# Patient Record
Sex: Female | Born: 1960
Health system: Southern US, Community
[De-identification: ages and names within clinical notes are randomized; demographics above are authoritative.]

## PROBLEM LIST (undated history)

## (undated) DIAGNOSIS — R52 Pain, unspecified: Secondary | ICD-10-CM

## (undated) DIAGNOSIS — M549 Dorsalgia, unspecified: Secondary | ICD-10-CM

## (undated) DIAGNOSIS — G8929 Other chronic pain: Secondary | ICD-10-CM

## (undated) HISTORY — PX: RHINOPLASTY: SUR1284

## (undated) HISTORY — PX: TONSILLECTOMY: SUR1361

## (undated) HISTORY — PX: APPENDECTOMY: SHX54

---

## 2015-03-31 ENCOUNTER — Encounter (HOSPITAL_COMMUNITY): Payer: Self-pay | Admitting: *Deleted

## 2015-03-31 ENCOUNTER — Emergency Department (HOSPITAL_COMMUNITY)
Admission: EM | Admit: 2015-03-31 | Discharge: 2015-03-31 | Disposition: A | Discharge status: Home / Self Care | Payer: BLUE CROSS/BLUE SHIELD | Attending: Emergency Medicine | Admitting: Emergency Medicine

## 2015-03-31 DIAGNOSIS — M546 Pain in thoracic spine: Secondary | ICD-10-CM | POA: Insufficient documentation

## 2015-03-31 DIAGNOSIS — Z880 Allergy status to penicillin: Secondary | ICD-10-CM | POA: Diagnosis not present

## 2015-03-31 MED ORDER — HYDROCODONE-ACETAMINOPHEN 5-325 MG PO TABS: 1.0000 | ORAL_TABLET | ORAL | Status: DC | PRN

## 2015-03-31 MED ORDER — METHOCARBAMOL 500 MG PO TABS: 500.0000 mg | ORAL_TABLET | Freq: Two times a day (BID) | ORAL | Status: DC

## 2015-03-31 MED ORDER — METHOCARBAMOL 500 MG PO TABS
1000.0000 mg | ORAL_TABLET | Freq: Once | ORAL | Status: AC
  Administered 2015-03-31: 1000 mg via ORAL
  Filled 2015-03-31: qty 2

## 2015-03-31 MED ORDER — IBUPROFEN 800 MG PO TABS: 800.0000 mg | ORAL_TABLET | Freq: Three times a day (TID) | ORAL | Status: DC

## 2015-03-31 NOTE — Discharge Instructions (Signed)
Back Pain, Adult Low back pain is very common. About 1 in 5 people have back pain.The cause of low back pain is rarely dangerous. The pain often gets better over time.About half of people with a sudden onset of back pain feel better in just 2 weeks. About 8 in 10 people feel better by 6 weeks.  CAUSES Some common causes of back pain include:  Strain of the muscles or ligaments supporting the spine.  Wear and tear (degeneration) of the spinal discs.  Arthritis.  Direct injury to the back. DIAGNOSIS Most of the time, the direct cause of low back pain is not known.However, back pain can be treated effectively even when the exact cause of the pain is unknown.Answering your caregiver's questions about your overall health and symptoms is one of the most accurate ways to make sure the cause of your pain is not dangerous. If your caregiver needs more information, he or she may order lab work or imaging tests (X-rays or MRIs).However, even if imaging tests show changes in your back, this usually does not require surgery. HOME CARE INSTRUCTIONS For many people, back pain returns.Since low back pain is rarely dangerous, it is often a condition that people can learn to manageon their own.   Remain active. It is stressful on the back to sit or stand in one place. Do not sit, drive, or stand in one place for more than 30 minutes at a time. Take short walks on level surfaces as soon as pain allows.Try to increase the length of time you walk each day.  Do not stay in bed.Resting more than 1 or 2 days can delay your recovery.  Do not avoid exercise or work.Your body is made to move.It is not dangerous to be active, even though your back may hurt.Your back will likely heal faster if you return to being active before your pain is gone.  Pay attention to your body when you bend and lift. Many people have less discomfortwhen lifting if they bend their knees, keep the load close to their bodies,and  avoid twisting. Often, the most comfortable positions are those that put less stress on your recovering back.  Find a comfortable position to sleep. Use a firm mattress and lie on your side with your knees slightly bent. If you lie on your back, put a pillow under your knees.  Only take over-the-counter or prescription medicines as directed by your caregiver. Over-the-counter medicines to reduce pain and inflammation are often the most helpful.Your caregiver may prescribe muscle relaxant drugs.These medicines help dull your pain so you can more quickly return to your normal activities and healthy exercise.  Put ice on the injured area.  Put ice in a plastic bag.  Place a towel between your skin and the bag.  Leave the ice on for 15-20 minutes, 03-04 times a day for the first 2 to 3 days. After that, ice and heat may be alternated to reduce pain and spasms.  Ask your caregiver about trying back exercises and gentle massage. This may be of some benefit.  Avoid feeling anxious or stressed.Stress increases muscle tension and can worsen back pain.It is important to recognize when you are anxious or stressed and learn ways to manage it.Exercise is a great option. SEEK MEDICAL CARE IF:  You have pain that is not relieved with rest or medicine.  You have pain that does not improve in 1 week.  You have new symptoms.  You are generally not feeling well. SEEK   IMMEDIATE MEDICAL CARE IF:   You have pain that radiates from your back into your legs.  You develop new bowel or bladder control problems.  You have unusual weakness or numbness in your arms or legs.  You develop nausea or vomiting.  You develop abdominal pain.  You feel faint. Document Released: 07/19/2005 Document Revised: 01/18/2012 Document Reviewed: 11/20/2013 [employer] Patient Information 2015 [employer]. This information is not intended to replace advice given to you by your health care provider. Make sure you  discuss any questions you have with your health care provider.  

## 2015-03-31 NOTE — ED Provider Notes (Signed)
CSN: [id number]     Arrival date & time 03/31/15  2101 History  This chart was scribed for Elpidio Anis, working with Tilden Fossa, MD by Chestine Spore, ED Scribe. The patient was seen in room WTR7/WTR7 at 10:05 PM.    Chief Complaint  Patient presents with  . Back Pain      The history is provided by the patient. No language interpreter was used.    HPI Comments: Karen Buchanan is a 54 y.o. female who presents to the Emergency Department complaining of mid back pain onset 2 months worsening last week. She notes that her mid back pain radiates to the front of her chest under her breast. Pt reports that when the pain is at its worse she will have abdominal cramps. Pt denies her back pain being worsened by deep breathing. Pt denies injury or trauma. She reports that she is unable to find a comfortable position and that her pain is sometimes alleviated with standing but not for long periods of time. Pt had an appointment today with her pain management doctor but they cancelled the appointment for today. Pt has another appointment with a pain management clinic but it isn't until this Wednesday. She states that she has tried cupping method, acupunture, 800 mg Ibuprofen and hydrocodone with minimal relief for her symptoms. Pt had the hydrocodone remaining from a dental procedure. Pt denies gait problem, SOB, numbness, tingling, abdominal pain, CP, and any other symptoms. Pt denies having back surgeries in the past. Pt denies having a MRI of her back. Pt has not seen an orthopedist or neurologist.    History reviewed. No pertinent past medical history. Past Surgical History  Procedure Laterality Date  . Rhinoplasty    . Tonsillectomy     No family history on file. Social History  Substance Use Topics  . Smoking status: Never Smoker   . Smokeless tobacco: None  . Alcohol Use: Yes     Comment: socially   OB History    No data available     Review of Systems  Respiratory: Negative for  shortness of breath.   Cardiovascular: Negative for chest pain.  Gastrointestinal: Negative for abdominal pain.  Musculoskeletal: Positive for back pain. Negative for gait problem.  Skin: Negative for color change, rash and wound.      Allergies  Penicillins  Home Medications   Prior to Admission medications   Not on File   BP 123/61 mmHg  Pulse 71  Temp(Src) 98.2 F (36.8 C) (Oral)  Resp 16  SpO2 98% Physical Exam  Constitutional: She is oriented to person, place, and time. She appears well-developed and well-nourished. No distress.  HENT:  Head: Normocephalic and atraumatic.  Eyes: EOM are normal.  Neck: Neck supple. No tracheal deviation present.  Cardiovascular: Normal rate.   Pulmonary/Chest: Effort normal and breath sounds normal. No respiratory distress. She has no wheezes. She has no rales. She exhibits no tenderness.  Abdominal: Soft. There is no tenderness.  Musculoskeletal: Normal range of motion.  No swelling of the thoracic spine or paraspinal area. No reproducible tenderness. Full ROM.   Neurological: She is alert and oriented to person, place, and time.  Skin: Skin is warm and dry.  Multiple areas of circular hyperpigmentation consistent with cupping.  Psychiatric: She has a normal mood and affect. Her behavior is normal.  Nursing note and vitals reviewed.   ED Course  Procedures (including critical care time) DIAGNOSTIC STUDIES: Oxygen Saturation is 98% on RA, nl  by my interpretation.    COORDINATION OF CARE: 10:09 PM Discussed treatment plan with pt at bedside which includes muscle relaxer Rx, MRI to be completed out patient, and f/u with pain management clinic and pt agreed to plan.   Labs Review Labs Reviewed - No data to display  Imaging Review No results found. I have personally reviewed and evaluated these images and lab results as part of my medical decision-making.   EKG Interpretation None      MDM   Final diagnoses:  None     1. Midline thoracic back pain  The patient describes pain that is more intense that previous back pain now radiating around to the front, concerning for thoracic radicular pattern. No neurologic deficits. Will arrange MRI for outpatient appointment and follow up with orthopedics.   I personally performed the services described in this documentation, which was scribed in my presence. The recorded information has been reviewed and is accurate.     Elpidio Anis, PA-C 04/01/15 2039  Tilden Fossa, MD 04/01/15 2232

## 2015-03-31 NOTE — ED Notes (Signed)
Pt c/o back / spine pain; pt denies injury; denies numbness or tingling; pt states that she has been using cupping and accupuncture and that it has not helped; pt states that she she made an appointment with the pain management but it is Wed and she is having trouble with pain until then; pt states that she has been taking  of Ibuprofen and Percocet (that was left from a dental procedure) that only provides 2 hrs of relief; pt states that she cannot get comfortable or find a position to rest and sleep in

## 2015-04-04 ENCOUNTER — Emergency Department (HOSPITAL_COMMUNITY)
Admission: EM | Admit: 2015-04-04 | Discharge: 2015-04-04 | Disposition: A | Discharge status: Home / Self Care | Payer: BLUE CROSS/BLUE SHIELD | Attending: Emergency Medicine | Admitting: Emergency Medicine

## 2015-04-04 ENCOUNTER — Encounter (HOSPITAL_COMMUNITY): Payer: Self-pay | Admitting: Emergency Medicine

## 2015-04-04 DIAGNOSIS — Z880 Allergy status to penicillin: Secondary | ICD-10-CM | POA: Insufficient documentation

## 2015-04-04 DIAGNOSIS — IMO0002 Reserved for concepts with insufficient information to code with codable children: Secondary | ICD-10-CM

## 2015-04-04 DIAGNOSIS — Z760 Encounter for issue of repeat prescription: Secondary | ICD-10-CM

## 2015-04-04 DIAGNOSIS — Z791 Long term (current) use of non-steroidal anti-inflammatories (NSAID): Secondary | ICD-10-CM | POA: Diagnosis not present

## 2015-04-04 DIAGNOSIS — G8929 Other chronic pain: Secondary | ICD-10-CM | POA: Insufficient documentation

## 2015-04-04 DIAGNOSIS — W108XXD Fall (on) (from) other stairs and steps, subsequent encounter: Secondary | ICD-10-CM | POA: Diagnosis not present

## 2015-04-04 DIAGNOSIS — Z79899 Other long term (current) drug therapy: Secondary | ICD-10-CM | POA: Diagnosis not present

## 2015-04-04 DIAGNOSIS — S22080D Wedge compression fracture of T11-T12 vertebra, subsequent encounter for fracture with routine healing: Secondary | ICD-10-CM | POA: Insufficient documentation

## 2015-04-04 DIAGNOSIS — M549 Dorsalgia, unspecified: Secondary | ICD-10-CM | POA: Diagnosis present

## 2015-04-04 HISTORY — DX: Pain, unspecified: R52

## 2015-04-04 HISTORY — DX: Dorsalgia, unspecified: M54.9

## 2015-04-04 HISTORY — DX: Other chronic pain: G89.29

## 2015-04-04 MED ORDER — HYDROCODONE-ACETAMINOPHEN 5-325 MG PO TABS: 2.0000 | ORAL_TABLET | ORAL | Status: DC | PRN

## 2015-04-04 NOTE — ED Notes (Signed)
Pt left before receiving discharge vitals. Pt received discharge paperwork/instructions before leaving

## 2015-04-04 NOTE — Discharge Instructions (Signed)
Medication Refill, Emergency Department Follow-up with your orthopedist on Friday. We have refilled your medication today as a courtesy to you. It is best for your medical care, however, to take care of getting refills done through your primary caregiver's office. They have your records and can do a better job of follow-up than we can in the emergency department. On maintenance medications, we often only prescribe enough medications to get you by until you are able to see your regular caregiver. This is a more expensive way to refill medications. In the future, please plan for refills so that you will not have to use the emergency department for this. Thank you for your help. Your help allows [place] to better take care of the daily emergencies that enter our department. Document Released: 11/05/2003 Document Revised: 10/11/2011 Document Reviewed: 10/26/2013 [facility] Patient Information 2015 [place], [place]. This information is not intended to replace advice given to you by your health care provider. Make sure you discuss any questions you have with your health care provider.

## 2015-04-04 NOTE — ED Provider Notes (Signed)
History  This chart was scribed for non-physician practitioner, Catha Gosselin, PA-C,working with Lyndal Pulley, MD, by Karle Plumber, ED Scribe. This patient was seen in room WTR7/WTR7 and the patient's care was started at 7:32 PM.  Chief Complaint  Patient presents with  . Back Pain   The history is provided by the patient and medical records. No language interpreter was used.    HPI Comments:  Karen Buchanan is a 54 y.o. female with PMHx of chronic back pain, who presents to the Emergency Department complaining of worsening, severe mid back pain. Pt reports that she fell down a flight of stairs about 7 months ago and states the pain started increasing about three weeks ago. She was seen here and had an outpatient MRI scheduled and was prescribed #8 Vicodin, Robaxin and Ibuprofen. She states her MRI was done yesterday and showed a T-11 compression fracture with degenerative changes. Pt states she was seen by her orthopedist two days ago and was prescribed Tramadol and Diclofenac that she reports taking as directed that was helping at first but now is not due to the pain getting worse. She states she has an appt with her orthopedist on Friday 04/13/15 (one week). Lying or any other movements exacerbate the pain. She denies alleviating factors. She denies bowel or bladder incontinence, bruising, wounds, numbness, tingling or weakness of any extremities, nausea, vomiting, fever or chills. She denies any new trauma, injury or fall.  Past Medical History  Diagnosis Date  . Chronic back pain   . Pain management    Past Surgical History  Procedure Laterality Date  . Rhinoplasty    . Tonsillectomy     No family history on file. Social History  Substance Use Topics  . Smoking status: Never Smoker   . Smokeless tobacco: None  . Alcohol Use: Yes     Comment: socially   OB History    No data available     Review of Systems  Constitutional: Negative for fever and chills.   Gastrointestinal: Negative for nausea and vomiting.  Genitourinary:       No bowel or bladder incontinence  Musculoskeletal: Positive for back pain.  Skin: Negative for color change and wound.  Neurological: Negative for weakness and numbness.    Allergies  Penicillins  Home Medications   Prior to Admission medications   Medication Sig Start Date End Date Taking? Authorizing Provider  diclofenac (VOLTAREN) 75 MG EC tablet Take 75 mg by mouth 2 (two) times daily as needed for moderate pain.   Yes Historical Provider, MD  HYDROcodone-acetaminophen (NORCO/VICODIN) 5-325 MG per tablet Take 1-2 tablets by mouth every 4 (four) hours as needed for moderate pain or severe pain.   Yes Historical Provider, MD  ibuprofen (ADVIL,MOTRIN) 800 MG tablet Take 1 tablet (800 mg total) by mouth 3 (three) times daily. 03/31/15  Yes Shari Upstill, PA-C  methocarbamol (ROBAXIN) 500 MG tablet Take 1 tablet (500 mg total) by mouth 2 (two) times daily. 03/31/15  Yes Elpidio Anis, PA-C  PRESCRIPTION MEDICATION Apply 1 application topically daily. Bi-Est Cream   Yes Historical Provider, MD  PRESCRIPTION MEDICATION Apply 1 application topically daily. Progestorone Cream   Yes Historical Provider, MD  testosterone (ANDROGEL) 50 MG/5GM (1%) GEL Place 5 g onto the skin daily.   Yes Historical Provider, MD  traMADol (ULTRAM) 50 MG tablet Take 50 mg by mouth every 6 (six) hours as needed for moderate pain or severe pain.   Yes Historical Provider, MD  HYDROcodone-acetaminophen (NORCO/VICODIN) 5-325 MG per tablet Take 2 tablets by mouth every 4 (four) hours as needed. 04/04/15   Catha Gosselin, PA-C   Triage Vitals: BP 114/76 mmHg  Pulse 59  Temp(Src) 98.3 F (36.8 C) (Oral)  Resp 16  SpO2 99% Physical Exam  Constitutional: She is oriented to person, place, and time. She appears well-developed and well-nourished.  HENT:  Head: Normocephalic and atraumatic.  Eyes: Conjunctivae and EOM are normal.  Neck: Normal  range of motion.  Cardiovascular: Normal rate.   Pulmonary/Chest: Effort normal. No respiratory distress.  Abdominal: Soft.  Musculoskeletal: Normal range of motion.  Neurological: She is alert and oriented to person, place, and time. She has normal strength. No sensory deficit. GCS eye subscore is 4. GCS verbal subscore is 5. GCS motor subscore is 6.  Ambulatory with steady gait. No lower extremity weakness or saddle anesthesia. No numbness in lower extremities.  Skin: Skin is warm and dry.  Psychiatric: She has a normal mood and affect. Her behavior is normal.  Nursing note and vitals reviewed.   ED Course  Procedures (including critical care time) DIAGNOSTIC STUDIES: Oxygen Saturation is 99% on RA, normal by my interpretation.   COORDINATION OF CARE: 7:46 PM- Will prescribe pt 6 more Vicodin and explained to her that we cannot treat chronic pain in the emergency department and she should follow up with orthopedist or PCP for further pain control. Pt verbalizes understanding and agrees to plan.   MDM   Final diagnoses:  Compression fracture  Medication refill  Patient presents for back pain that is chronic in nature.  Recent MRI results show T11 compression fracture. She was given diclofenac and tramadol by her orthopedist which she has with her and states it does not help.  She is requesting hydrocodone for pain since it is the only thing that helps. She was give #8 by the PA four days ago.  I discussed that I would provide 6 hydrocodone for her pain until her appointment next week with her orthopedist. I also discussed that we do not provider narcotic pain medication as a refill from the ED and that this was a courtesy until she can see her physician. Patient stated she would go to another hospital for pain medication if necessary. Medications - No data to display  I personally performed the services described in this documentation, which was scribed in my presence. The recorded  information has been reviewed and is accurate.    Catha Gosselin, PA-C 04/05/15 1329  Lyndal Pulley, MD 04/05/15 [phone]

## 2015-04-04 NOTE — ED Notes (Signed)
Pt reports back pain, MRI done yesterday. T9 compression fracture with disc bulging. Sts she ran out of hydrocodone, had meds changed by pain MD, but they are not working. Requesting pain meds to get her through the weekend until she can be seen by MD again on Tuesday.

## 2015-04-29 ENCOUNTER — Other Ambulatory Visit: Payer: Self-pay | Admitting: Family Medicine

## 2015-04-29 DIAGNOSIS — E041 Nontoxic single thyroid nodule: Secondary | ICD-10-CM

## 2015-05-14 ENCOUNTER — Ambulatory Visit
Admission: RE | Admit: 2015-05-14 | Discharge: 2015-05-14 | Disposition: A | Discharge status: Home / Self Care | Payer: BLUE CROSS/BLUE SHIELD | Source: Ambulatory Visit | Attending: Family Medicine | Admitting: Family Medicine

## 2015-05-14 DIAGNOSIS — E041 Nontoxic single thyroid nodule: Secondary | ICD-10-CM

## 2015-05-22 ENCOUNTER — Other Ambulatory Visit: Payer: Self-pay | Admitting: Family Medicine

## 2015-05-22 DIAGNOSIS — E041 Nontoxic single thyroid nodule: Secondary | ICD-10-CM

## 2015-05-28 ENCOUNTER — Other Ambulatory Visit (HOSPITAL_COMMUNITY)
Admission: RE | Admit: 2015-05-28 | Discharge: 2015-05-28 | Disposition: A | Discharge status: Home / Self Care | Payer: BLUE CROSS/BLUE SHIELD | Source: Ambulatory Visit | Attending: Radiology | Admitting: Radiology

## 2015-05-28 ENCOUNTER — Ambulatory Visit
Admission: RE | Admit: 2015-05-28 | Discharge: 2015-05-28 | Disposition: A | Discharge status: Home / Self Care | Payer: BLUE CROSS/BLUE SHIELD | Source: Ambulatory Visit | Attending: Family Medicine | Admitting: Family Medicine

## 2015-05-28 ENCOUNTER — Other Ambulatory Visit: Payer: BLUE CROSS/BLUE SHIELD

## 2015-05-28 DIAGNOSIS — E041 Nontoxic single thyroid nodule: Secondary | ICD-10-CM | POA: Diagnosis not present

## 2015-07-16 ENCOUNTER — Other Ambulatory Visit: Payer: Self-pay | Admitting: Family Medicine

## 2015-07-16 ENCOUNTER — Other Ambulatory Visit (HOSPITAL_COMMUNITY)
Admission: RE | Admit: 2015-07-16 | Discharge: 2015-07-16 | Disposition: A | Discharge status: Home / Self Care | Payer: BLUE CROSS/BLUE SHIELD | Source: Ambulatory Visit | Attending: Family Medicine | Admitting: Family Medicine

## 2015-07-16 DIAGNOSIS — Z1151 Encounter for screening for human papillomavirus (HPV): Secondary | ICD-10-CM | POA: Diagnosis not present

## 2015-07-16 DIAGNOSIS — Z124 Encounter for screening for malignant neoplasm of cervix: Secondary | ICD-10-CM | POA: Insufficient documentation

## 2015-07-17 LAB — CYTOLOGY - PAP

## 2015-10-03 ENCOUNTER — Other Ambulatory Visit: Payer: Self-pay | Admitting: Physical Medicine and Rehabilitation

## 2015-10-03 DIAGNOSIS — R1013 Epigastric pain: Secondary | ICD-10-CM

## 2015-10-03 DIAGNOSIS — R1011 Right upper quadrant pain: Secondary | ICD-10-CM

## 2015-10-06 ENCOUNTER — Other Ambulatory Visit: Payer: BLUE CROSS/BLUE SHIELD

## 2015-10-16 ENCOUNTER — Ambulatory Visit
Admission: RE | Admit: 2015-10-16 | Discharge: 2015-10-16 | Disposition: A | Discharge status: Home / Self Care | Payer: BLUE CROSS/BLUE SHIELD | Source: Ambulatory Visit | Attending: Physical Medicine and Rehabilitation | Admitting: Physical Medicine and Rehabilitation

## 2015-10-16 DIAGNOSIS — R1013 Epigastric pain: Secondary | ICD-10-CM

## 2015-10-16 DIAGNOSIS — R1011 Right upper quadrant pain: Secondary | ICD-10-CM

## 2015-11-07 ENCOUNTER — Other Ambulatory Visit: Payer: Self-pay | Admitting: Physical Medicine and Rehabilitation

## 2015-11-07 DIAGNOSIS — R252 Cramp and spasm: Secondary | ICD-10-CM

## 2015-11-07 DIAGNOSIS — R1310 Dysphagia, unspecified: Secondary | ICD-10-CM

## 2015-11-17 ENCOUNTER — Ambulatory Visit
Admission: RE | Admit: 2015-11-17 | Discharge: 2015-11-17 | Disposition: A | Discharge status: Home / Self Care | Payer: BLUE CROSS/BLUE SHIELD | Source: Ambulatory Visit | Attending: Physical Medicine and Rehabilitation | Admitting: Physical Medicine and Rehabilitation

## 2015-11-17 DIAGNOSIS — R252 Cramp and spasm: Secondary | ICD-10-CM

## 2015-11-17 DIAGNOSIS — R1310 Dysphagia, unspecified: Secondary | ICD-10-CM

## 2015-12-04 ENCOUNTER — Other Ambulatory Visit: Payer: Self-pay | Admitting: Surgery

## 2015-12-04 DIAGNOSIS — E041 Nontoxic single thyroid nodule: Secondary | ICD-10-CM

## 2016-01-19 ENCOUNTER — Other Ambulatory Visit: Payer: BLUE CROSS/BLUE SHIELD

## 2016-01-26 ENCOUNTER — Ambulatory Visit
Admission: RE | Admit: 2016-01-26 | Discharge: 2016-01-26 | Disposition: A | Discharge status: Home / Self Care | Payer: BLUE CROSS/BLUE SHIELD | Source: Ambulatory Visit | Attending: Surgery | Admitting: Surgery

## 2016-01-26 DIAGNOSIS — E041 Nontoxic single thyroid nodule: Secondary | ICD-10-CM

## 2016-04-07 ENCOUNTER — Encounter (HOSPITAL_BASED_OUTPATIENT_CLINIC_OR_DEPARTMENT_OTHER): Payer: Self-pay

## 2016-04-07 ENCOUNTER — Emergency Department (HOSPITAL_BASED_OUTPATIENT_CLINIC_OR_DEPARTMENT_OTHER)
Admission: EM | Admit: 2016-04-07 | Discharge: 2016-04-07 | Disposition: A | Discharge status: Home / Self Care | Payer: BLUE CROSS/BLUE SHIELD | Attending: Emergency Medicine | Admitting: Emergency Medicine

## 2016-04-07 DIAGNOSIS — Z79891 Long term (current) use of opiate analgesic: Secondary | ICD-10-CM | POA: Insufficient documentation

## 2016-04-07 DIAGNOSIS — J340 Abscess, furuncle and carbuncle of nose: Secondary | ICD-10-CM | POA: Diagnosis not present

## 2016-04-07 DIAGNOSIS — Z791 Long term (current) use of non-steroidal anti-inflammatories (NSAID): Secondary | ICD-10-CM | POA: Insufficient documentation

## 2016-04-07 DIAGNOSIS — J3489 Other specified disorders of nose and nasal sinuses: Secondary | ICD-10-CM | POA: Diagnosis present

## 2016-04-07 MED ORDER — CLINDAMYCIN HCL 150 MG PO CAPS
300.0000 mg | ORAL_CAPSULE | Freq: Once | ORAL | Status: AC
  Administered 2016-04-07: 300 mg via ORAL
  Filled 2016-04-07: qty 2

## 2016-04-07 MED ORDER — CLINDAMYCIN HCL 300 MG PO CAPS: 300.0000 mg | ORAL_CAPSULE | Freq: Three times a day (TID) | ORAL | 0 refills | Status: DC

## 2016-04-07 MED ORDER — MUPIROCIN CALCIUM 2 % EX CREA: 1.0000 "application " | TOPICAL_CREAM | Freq: Two times a day (BID) | CUTANEOUS | 0 refills | Status: DC

## 2016-04-07 MED FILL — CLINDAMYCIN HCL 300 MG CAPS: 300 | 10 days supply | Qty: 30 | Fill #0

## 2016-04-07 MED FILL — MUPIROCIN 2% CREAM: 2 | 30 days supply | Qty: 30 | Fill #0

## 2016-04-07 NOTE — ED Provider Notes (Signed)
[facility] Provider Note   CSN: [id number] Arrival date & time: 04/07/16  1443     History   Chief Complaint Chief Complaint  Patient presents with  . Nose Problem    HPI Karen Buchanan is a 55 y.o. female.  She presents Plate of nasal pain and swelling. She states yesterday she had some soreness on the inside of her right nasal polyp. It was more swollen and painful today. The tip of her nose was red. She is still able to breathe through the nose. No past similar episodes.  History of sinus infections, this feels different,.  HPI  Past Medical History:  Diagnosis Date  . Chronic back pain   . Pain management     There are no active problems to display for this patient.   Past Surgical History:  Procedure Laterality Date  . RHINOPLASTY    . TONSILLECTOMY      OB History    No data available       Home Medications    Prior to Admission medications   Medication Sig Start Date End Date Taking? Authorizing Provider  Celecoxib (CELEBREX PO) Take by mouth.   Yes Historical Provider, MD  Cyclobenzaprine HCl (FLEXERIL PO) Take by mouth.   Yes Historical Provider, MD  oxyCODONE-acetaminophen (PERCOCET/ROXICET) 5-325 MG tablet Take by mouth every 4 (four) hours as needed for severe pain.   Yes Historical Provider, MD  clindamycin (CLEOCIN) 300 MG capsule Take 1 capsule (300 mg total) by mouth 3 (three) times daily. 04/07/16   Rolland Porter, MD  mupirocin cream (BACTROBAN) 2 % Apply 1 application topically 2 (two) times daily. 04/07/16   Rolland Porter, MD  PRESCRIPTION MEDICATION Apply 1 application topically daily. Bi-Est Cream    Historical Provider, MD  PRESCRIPTION MEDICATION Apply 1 application topically daily. Progestorone Cream    Historical Provider, MD  testosterone (ANDROGEL) 50 MG/5GM (1%) GEL Place 5 g onto the skin daily.    Historical Provider, MD    Family History No family history on file.  Social History Social History  Substance Use Topics  .  Smoking status: Never Smoker  . Smokeless tobacco: Never Used  . Alcohol use Yes     Comment: socially     Allergies   Penicillins   Review of Systems Review of Systems  Constitutional: Negative for appetite change, chills, diaphoresis, fatigue and fever.  HENT: Negative for mouth sores, sore throat and trouble swallowing.        Right nasal pain and swelling  Eyes: Negative for visual disturbance.  Respiratory: Negative for cough, chest tightness, shortness of breath and wheezing.   Cardiovascular: Negative for chest pain.  Gastrointestinal: Negative for abdominal distention, abdominal pain, diarrhea, nausea and vomiting.  Endocrine: Negative for polydipsia, polyphagia and polyuria.  Genitourinary: Negative for dysuria, frequency and hematuria.  Musculoskeletal: Negative for gait problem.  Skin: Negative for color change, pallor and rash.  Neurological: Negative for dizziness, syncope, light-headedness and headaches.  Hematological: Does not bruise/bleed easily.  Psychiatric/Behavioral: Negative for behavioral problems and confusion.     Physical Exam Updated Vital Signs BP 117/69 (BP Location: Left Arm)   Pulse 78   Temp 97.8 F (36.6 C) (Oral)   Resp 18   Ht 5\' 5"  (1.651 m)   Wt 125 lb (56.7 kg)   SpO2 100%   BMI 20.80 kg/m   Physical Exam  Constitutional: She is oriented to person, place, and time. She appears well-developed and well-nourished. No  distress.  HENT:  Head: Normocephalic.  Nose:    Erythema and soft tissue swelling adjacent the right nasal polyp. This does not extend onto the maxilla. Intranasal exam shows swelling but no obvious abscess. Periorbital tissues are normal. Normal extra ocular movements. No vesicles.  Eyes: Conjunctivae are normal. Pupils are equal, round, and reactive to light. No scleral icterus.  Neck: Normal range of motion. Neck supple. No thyromegaly present.  Cardiovascular: Normal rate and regular rhythm.  Exam reveals no  gallop and no friction rub.   No murmur heard. Pulmonary/Chest: Effort normal and breath sounds normal. No respiratory distress. She has no wheezes. She has no rales.  Abdominal: Soft. Bowel sounds are normal. She exhibits no distension. There is no tenderness. There is no rebound.  Musculoskeletal: Normal range of motion.  Neurological: She is alert and oriented to person, place, and time.  Skin: Skin is warm and dry. No rash noted.  Psychiatric: She has a normal mood and affect. Her behavior is normal.     ED Treatments / Results  Labs (all labs ordered are listed, but only abnormal results are displayed) Labs Reviewed - No data to display  EKG  EKG Interpretation None       Radiology No results found.  Procedures Procedures (including critical care time)  Medications Ordered in ED Medications  clindamycin (CLEOCIN) capsule 300 mg (not administered)     Initial Impression / Assessment and Plan / ED Course  I have reviewed the triage vital signs and the nursing notes.  Pertinent labs & imaging results that were available during my care of the patient were reviewed by me and considered in my medical decision making (see chart for details).  Clinical Course    I discussed with the patient's her symptoms were not consistent with a sinusitis. Likely simple nasal based cellulitis.  Cover for MRSA. We'll use mupirocin, clindamycin with consideration of her penicillin and doxycycline allergies. I told her that this will slowly improve and not expect marked changes until today 3. This extends into soft tissues of her eye, developed blisters across the face, or worsening swelling on the inside of her nose she can return at anytime for reevaluation.  Final Clinical Impressions(s) / ED Diagnoses   Final diagnoses:  Cellulitis of nasal tip    New Prescriptions New Prescriptions   CLINDAMYCIN (CLEOCIN) 300 MG CAPSULE    Take 1 capsule (300 mg total) by mouth 3 (three) times  daily.   MUPIROCIN CREAM (BACTROBAN) 2 %    Apply 1 application topically 2 (two) times daily.     Rolland PorterMark Kaveh Kissinger, MD 04/07/16 1515

## 2016-04-07 NOTE — Discharge Instructions (Signed)
Expect slow improvement. By day three symptoms and appearance should be improving.  Return here in nor improving in 72 hours, or with obvious worsening, eye lid swelling, or other changes.

## 2016-04-07 NOTE — ED Triage Notes (Signed)
C/o swelling to right side of nose started yesterday-sent from PCP office-NAD-steady gait

## 2016-05-20 ENCOUNTER — Inpatient Hospital Stay (HOSPITAL_BASED_OUTPATIENT_CLINIC_OR_DEPARTMENT_OTHER)
Admission: EM | Admit: 2016-05-20 | Discharge: 2016-05-26 | DRG: 340 | Disposition: A | Discharge status: Home / Self Care | Payer: BLUE CROSS/BLUE SHIELD | Attending: Surgery | Admitting: Surgery

## 2016-05-20 ENCOUNTER — Emergency Department (HOSPITAL_BASED_OUTPATIENT_CLINIC_OR_DEPARTMENT_OTHER): Payer: BLUE CROSS/BLUE SHIELD

## 2016-05-20 ENCOUNTER — Encounter (HOSPITAL_BASED_OUTPATIENT_CLINIC_OR_DEPARTMENT_OTHER): Payer: Self-pay | Admitting: *Deleted

## 2016-05-20 DIAGNOSIS — K3532 Acute appendicitis with perforation and localized peritonitis, without abscess: Secondary | ICD-10-CM

## 2016-05-20 DIAGNOSIS — K3580 Unspecified acute appendicitis: Secondary | ICD-10-CM

## 2016-05-20 DIAGNOSIS — K352 Acute appendicitis with generalized peritonitis: Principal | ICD-10-CM | POA: Diagnosis present

## 2016-05-20 DIAGNOSIS — G8929 Other chronic pain: Secondary | ICD-10-CM | POA: Diagnosis present

## 2016-05-20 DIAGNOSIS — Z79899 Other long term (current) drug therapy: Secondary | ICD-10-CM | POA: Diagnosis not present

## 2016-05-20 DIAGNOSIS — M549 Dorsalgia, unspecified: Secondary | ICD-10-CM | POA: Diagnosis present

## 2016-05-20 LAB — CBC WITH DIFFERENTIAL/PLATELET
BASOS ABS: 0 10*3/uL (ref 0.0–0.1)
Basophils Relative: 0 %
EOS ABS: 0 10*3/uL (ref 0.0–0.7)
EOS PCT: 0 %
HCT: 42.8 % (ref 36.0–46.0)
Hemoglobin: 14.5 g/dL (ref 12.0–15.0)
LYMPHS ABS: 0.9 10*3/uL (ref 0.7–4.0)
Lymphocytes Relative: 5 %
MCH: 30.5 pg (ref 26.0–34.0)
MCHC: 33.9 g/dL (ref 30.0–36.0)
MCV: 89.9 fL (ref 78.0–100.0)
Monocytes Absolute: 1.1 10*3/uL — ABNORMAL HIGH (ref 0.1–1.0)
Monocytes Relative: 7 %
Neutro Abs: 14.5 10*3/uL — ABNORMAL HIGH (ref 1.7–7.7)
Neutrophils Relative %: 88 %
PLATELETS: 197 10*3/uL (ref 150–400)
RBC: 4.76 MIL/uL (ref 3.87–5.11)
RDW: 14.1 % (ref 11.5–15.5)
WBC: 16.6 10*3/uL — AB (ref 4.0–10.5)

## 2016-05-20 LAB — CBC
HEMATOCRIT: 41.8 % (ref 36.0–46.0)
HEMOGLOBIN: 14.4 g/dL (ref 12.0–15.0)
MCH: 30.5 pg (ref 26.0–34.0)
MCHC: 34.4 g/dL (ref 30.0–36.0)
MCV: 88.6 fL (ref 78.0–100.0)
Platelets: 162 10*3/uL (ref 150–400)
RBC: 4.72 MIL/uL (ref 3.87–5.11)
RDW: 14 % (ref 11.5–15.5)
WBC: 10.2 10*3/uL (ref 4.0–10.5)

## 2016-05-20 LAB — COMPREHENSIVE METABOLIC PANEL
ALK PHOS: 45 U/L (ref 38–126)
ALT: 17 U/L (ref 14–54)
AST: 25 U/L (ref 15–41)
Albumin: 4.2 g/dL (ref 3.5–5.0)
Anion gap: 7 (ref 5–15)
BILIRUBIN TOTAL: 0.9 mg/dL (ref 0.3–1.2)
BUN: 11 mg/dL (ref 6–20)
CALCIUM: 9.1 mg/dL (ref 8.9–10.3)
CO2: 25 mmol/L (ref 22–32)
CREATININE: 0.7 mg/dL (ref 0.44–1.00)
Chloride: 104 mmol/L (ref 101–111)
Glucose, Bld: 128 mg/dL — ABNORMAL HIGH (ref 65–99)
Potassium: 4.1 mmol/L (ref 3.5–5.1)
Sodium: 136 mmol/L (ref 135–145)
Total Protein: 7 g/dL (ref 6.5–8.1)

## 2016-05-20 LAB — CREATININE, SERUM
Creatinine, Ser: 0.7 mg/dL (ref 0.44–1.00)
GFR calc Af Amer: >60 mL/min (ref >60)

## 2016-05-20 LAB — URINALYSIS, ROUTINE W REFLEX MICROSCOPIC
Bilirubin Urine: NEGATIVE
Glucose, UA: NEGATIVE mg/dL
HGB URINE DIPSTICK: NEGATIVE
KETONES UR: 15 mg/dL — AB
Leukocytes, UA: NEGATIVE
Nitrite: NEGATIVE
PROTEIN: NEGATIVE mg/dL
Specific Gravity, Urine: 1.013 (ref 1.005–1.030)
pH: 7 (ref 5.0–8.0)

## 2016-05-20 LAB — LIPASE, BLOOD: LIPASE: 23 U/L (ref 11–51)

## 2016-05-20 MED ORDER — FENTANYL CITRATE (PF) 100 MCG/2ML IJ SOLN
100.0000 ug | Freq: Once | INTRAMUSCULAR | Status: AC
  Administered 2016-05-20: 100 ug via INTRAVENOUS
  Filled 2016-05-20: qty 2

## 2016-05-20 MED ORDER — DEXTROSE 5 % IV SOLN: 1.0000 g | Freq: Once | INTRAVENOUS | Status: DC

## 2016-05-20 MED ORDER — PIPERACILLIN-TAZOBACTAM 3.375 G IVPB 30 MIN
3.3750 g | Freq: Once | INTRAVENOUS | Status: AC
  Administered 2016-05-20: 3.375 g via INTRAVENOUS
  Filled 2016-05-20: qty 50

## 2016-05-20 MED ORDER — METRONIDAZOLE IN NACL 5-0.79 MG/ML-% IV SOLN
500.0000 mg | Freq: Once | INTRAVENOUS | Status: AC
  Administered 2016-05-20: 500 mg via INTRAVENOUS
  Filled 2016-05-20: qty 100

## 2016-05-20 MED ORDER — PIPERACILLIN-TAZOBACTAM 3.375 G IVPB: 3.3750 g | Freq: Three times a day (TID) | INTRAVENOUS | Status: DC

## 2016-05-20 MED ORDER — MORPHINE SULFATE (PF) 4 MG/ML IV SOLN
4.0000 mg | Freq: Once | INTRAVENOUS | Status: AC
  Administered 2016-05-20: 4 mg via INTRAVENOUS
  Filled 2016-05-20: qty 1

## 2016-05-20 MED ORDER — FENTANYL CITRATE (PF) 100 MCG/2ML IJ SOLN: 100.0000 ug | INTRAMUSCULAR | Status: DC | PRN

## 2016-05-20 MED ORDER — SODIUM CHLORIDE 0.9 % IV BOLUS (SEPSIS)
1000.0000 mL | Freq: Once | INTRAVENOUS | Status: AC
  Administered 2016-05-20: 1000 mL via INTRAVENOUS

## 2016-05-20 MED ORDER — ONDANSETRON HCL 4 MG/2ML IJ SOLN
4.0000 mg | Freq: Once | INTRAMUSCULAR | Status: AC
Start: 2016-05-20 — End: 2016-05-20
  Administered 2016-05-20: 4 mg via INTRAVENOUS
  Filled 2016-05-20: qty 2

## 2016-05-20 MED ORDER — HEPARIN SODIUM (PORCINE) 5000 UNIT/ML IJ SOLN
5000.0000 [IU] | Freq: Three times a day (TID) | INTRAMUSCULAR | Status: DC
  Administered 2016-05-20 – 2016-05-26 (×15): 5000 [IU] via SUBCUTANEOUS
  Filled 2016-05-20 (×16): qty 1

## 2016-05-20 MED ORDER — DEXTROSE 5 % IV SOLN
1.0000 g | Freq: Once | INTRAVENOUS | Status: AC
  Administered 2016-05-20: 1 g via INTRAVENOUS
  Filled 2016-05-20: qty 10

## 2016-05-20 MED ORDER — FAMOTIDINE IN NACL 20-0.9 MG/50ML-% IV SOLN
20.0000 mg | Freq: Two times a day (BID) | INTRAVENOUS | Status: DC
  Administered 2016-05-20 – 2016-05-24 (×8): 20 mg via INTRAVENOUS
  Filled 2016-05-20 (×8): qty 50

## 2016-05-20 MED ORDER — KCL IN DEXTROSE-NACL 20-5-0.45 MEQ/L-%-% IV SOLN
INTRAVENOUS | Status: DC
  Administered 2016-05-20 – 2016-05-22 (×3): via INTRAVENOUS
  Administered 2016-05-22: 100 mL/h via INTRAVENOUS
  Administered 2016-05-24 – 2016-05-25 (×3): via INTRAVENOUS
  Filled 2016-05-20 (×10): qty 1000

## 2016-05-20 MED ORDER — ONDANSETRON 4 MG PO TBDP
4.0000 mg | ORAL_TABLET | Freq: Four times a day (QID) | ORAL | Status: DC | PRN
  Administered 2016-05-26 (×2): 4 mg via ORAL
  Filled 2016-05-20 (×2): qty 1

## 2016-05-20 MED ORDER — IOPAMIDOL (ISOVUE-300) INJECTION 61%
100.0000 mL | Freq: Once | INTRAVENOUS | Status: AC | PRN
  Administered 2016-05-20: 100 mL via INTRAVENOUS

## 2016-05-20 MED ORDER — ONDANSETRON HCL 4 MG/2ML IJ SOLN
4.0000 mg | Freq: Four times a day (QID) | INTRAMUSCULAR | Status: DC | PRN
  Filled 2016-05-20: qty 2

## 2016-05-20 MED ORDER — PIPERACILLIN-TAZOBACTAM 3.375 G IVPB
3.3750 g | Freq: Three times a day (TID) | INTRAVENOUS | Status: DC
  Administered 2016-05-21 – 2016-05-25 (×13): 3.375 g via INTRAVENOUS
  Filled 2016-05-20 (×15): qty 50

## 2016-05-20 MED ORDER — FENTANYL CITRATE (PF) 100 MCG/2ML IJ SOLN
25.0000 ug | INTRAMUSCULAR | Status: DC | PRN
Start: 2016-05-20 — End: 2016-05-26
  Administered 2016-05-20 – 2016-05-25 (×34): 25 ug via INTRAVENOUS
  Filled 2016-05-20 (×35): qty 2

## 2016-05-20 NOTE — ED Notes (Signed)
Patient transported to CT 

## 2016-05-20 NOTE — ED Provider Notes (Signed)
The patient transferred in from [facility] with CT scan showing perforated appendicitis. Patient transferred in for admission by general surgery. Patient's been evaluated by general surgery and they will do the admitting orders. Full note as per [facility]. Patient hemodynamically stable upon arrival here. Patient had received Rocephin.   Karen MuldersScott Vanya Carberry, MD 05/20/16 1705

## 2016-05-20 NOTE — ED Notes (Signed)
Report called  

## 2016-05-20 NOTE — ED Provider Notes (Signed)
[facility] Provider Note   CSN: [id number] Arrival date & time: 05/20/16  1213     History   Chief Complaint Chief Complaint  Patient presents with  . Abdominal Pain    HPI Karen Buchanan is a 55 y.o. female.  Karen Buchanan is a 55 y.o. Female who presents to the ED complaining of right upper and lower abdominal pain since last night. Patient reports her pain began last night it has gradually worsened. No colicky pain. She reports pain to her right upper and lower abdomen. No back pain. She reports vomiting last night but none today. No diarrhea. She reports she is being followed by GI for abdominal spasms reported this feels very different. Bentyl has not helped. No treatments prior to arrival today. She reports this morning she had a temperature of 100.6. No antipyretics prior to arrival. The patient denies hematemesis, hematochezia, urinary symptoms, vaginal bleeding, vaginal discharge, previous abdominal surgeries, coughing, chest pain, shortness of breath or back pain.   The history is provided by the patient. No language interpreter was used.  Abdominal Pain   Associated symptoms include fever, nausea and vomiting. Pertinent negatives include diarrhea, dysuria, frequency, hematuria and headaches.    Past Medical History:  Diagnosis Date  . Chronic back pain   . Pain management     There are no active problems to display for this patient.   Past Surgical History:  Procedure Laterality Date  . RHINOPLASTY    . TONSILLECTOMY      OB History    No data available       Home Medications    Prior to Admission medications   Medication Sig Start Date End Date Taking? Authorizing Provider  Celecoxib (CELEBREX PO) Take by mouth.   Yes Historical Provider, MD  Cyclobenzaprine HCl (FLEXERIL PO) Take by mouth.   Yes Historical Provider, MD  dicyclomine (BENTYL) 10 MG capsule Take 10 mg by mouth 4 (four) times daily -  before meals and at bedtime.   Yes  Historical Provider, MD  oxyCODONE-acetaminophen (PERCOCET/ROXICET) 5-325 MG tablet Take by mouth every 4 (four) hours as needed for severe pain.   Yes Historical Provider, MD  clindamycin (CLEOCIN) 300 MG capsule Take 1 capsule (300 mg total) by mouth 3 (three) times daily. 04/07/16   Rolland Porter, MD  mupirocin cream (BACTROBAN) 2 % Apply 1 application topically 2 (two) times daily. 04/07/16   Rolland Porter, MD  PRESCRIPTION MEDICATION Apply 1 application topically daily. Bi-Est Cream    Historical Provider, MD  PRESCRIPTION MEDICATION Apply 1 application topically daily. Progestorone Cream    Historical Provider, MD  testosterone (ANDROGEL) 50 MG/5GM (1%) GEL Place 5 g onto the skin daily.    Historical Provider, MD    Family History No family history on file.  Social History Social History  Substance Use Topics  . Smoking status: Never Smoker  . Smokeless tobacco: Never Used  . Alcohol use Yes     Comment: socially     Allergies   Penicillins   Review of Systems Review of Systems  Constitutional: Positive for fever.  HENT: Negative for congestion and sore throat.   Eyes: Negative for visual disturbance.  Respiratory: Negative for cough, shortness of breath and wheezing.   Cardiovascular: Negative for chest pain.  Gastrointestinal: Positive for abdominal pain, nausea and vomiting. Negative for blood in stool and diarrhea.  Genitourinary: Negative for difficulty urinating, dysuria, flank pain, frequency, hematuria, urgency, vaginal bleeding and vaginal  discharge.  Musculoskeletal: Negative for back pain and neck pain.  Skin: Negative for rash.  Neurological: Negative for syncope and headaches.     Physical Exam Updated Vital Signs BP 128/73   Pulse 81   Temp 99.1 F (37.3 C)   Resp 18   Ht 5\' 5"  (1.651 m)   Wt 56.7 kg   SpO2 100%   BMI 20.80 kg/m   Physical Exam  Constitutional: She is oriented to person, place, and time. She appears well-developed and well-nourished.  No distress.  Nontoxic appearing. Patient appears to be uncomfortable. She is walking around the room.  HENT:  Head: Normocephalic and atraumatic.  Mouth/Throat: Oropharynx is clear and moist.  Eyes: Conjunctivae are normal. Pupils are equal, round, and reactive to light. Right eye exhibits no discharge. Left eye exhibits no discharge.  Neck: Neck supple.  Cardiovascular: Normal rate, regular rhythm, normal heart sounds and intact distal pulses.   No murmur heard. Pulmonary/Chest: Effort normal and breath sounds normal. No respiratory distress. She has no wheezes. She has no rales.  Lungs are clear to auscultation bilaterally.  Abdominal: Soft. Bowel sounds are normal. She exhibits no distension and no mass. There is tenderness. There is guarding. There is no rebound.  Abdomen is soft. Bowel sounds are present. Patient has tenderness to her right upper and lower quadrant. Right lower quadrant seems to be worse in her right upper. She has some mild guarding. No CVA or flank tenderness. Positive psoas sign. No obturator sign.  Musculoskeletal: She exhibits no edema.  Lymphadenopathy:    She has no cervical adenopathy.  Neurological: She is alert and oriented to person, place, and time. Coordination normal.  Skin: Skin is warm and dry. Capillary refill takes less than 2 seconds. No rash noted. She is not diaphoretic. No erythema. No pallor.  Psychiatric: She has a normal mood and affect. Her behavior is normal.  Nursing note and vitals reviewed.    ED Treatments / Results  Labs (all labs ordered are listed, but only abnormal results are displayed) Labs Reviewed  COMPREHENSIVE METABOLIC PANEL - Abnormal; Notable for the following:       Result Value   Glucose, Bld 128 (*)    All other components within normal limits  CBC WITH DIFFERENTIAL/PLATELET - Abnormal; Notable for the following:    WBC 16.6 (*)    Neutro Abs 14.5 (*)    Monocytes Absolute 1.1 (*)    All other components within  normal limits  URINALYSIS, ROUTINE W REFLEX MICROSCOPIC (NOT AT [facility]) - Abnormal; Notable for the following:    Ketones, ur 15 (*)    All other components within normal limits  LIPASE, BLOOD    EKG  EKG Interpretation None       Radiology Ct Abdomen Pelvis W Contrast  Result Date: 05/20/2016 CLINICAL DATA:  Nausea, vomiting and right lower quadrant pain. Fever. Symptoms began last night. EXAM: CT ABDOMEN AND PELVIS WITH CONTRAST TECHNIQUE: Multidetector CT imaging of the abdomen and pelvis was performed using the standard protocol following bolus administration of intravenous contrast. CONTRAST:  ISOVUE-300 IOPAMIDOL (ISOVUE-300) INJECTION 61% COMPARISON:  Ultrasound 10/16/2015 FINDINGS: Lower chest: Lung bases are clear.  No pleural or pericardial fluid. Hepatobiliary: 3 mm cyst at the right dome. No other liver finding. No calcified gallstones. Pancreas: Normal Spleen: Normal Adrenals/Urinary Tract: Adrenal glands are normal. Kidneys are normal. Stomach/Bowel: There is an advanced inflammatory process in the right lower quadrant which represents acute appendicitis with rupture and  pronounced regional phlegmonous inflammatory change. Probable appendicolith. I do not see any low-density drainable collection. No bowel obstruction Vascular/Lymphatic: Aorta and IVC are normal. No retroperitoneal mass or lymphadenopathy. Reproductive: Uterus and adnexal regions are normal. Other: None significant Musculoskeletal: Mild degenerative changes at the thoracolumbar junction region. IMPRESSION: Advanced appendicitis with marked phlegmonous inflammatory change in the right lower quadrant. Probable ruptured appendicitis. No focal drainable collection. Electronically Signed   By: Paulina FusiMark  Shogry M.D.   On: 05/20/2016 14:45    Procedures Procedures (including critical care time)  Medications Ordered in ED Medications  cefTRIAXone (ROCEPHIN) 1 g in dextrose 5 % 50 mL IVPB (not administered)    metroNIDAZOLE (FLAGYL) IVPB 500 mg (not administered)  sodium chloride 0.9 % bolus 1,000 mL (not administered)  sodium chloride 0.9 % bolus 1,000 mL (0 mLs Intravenous Stopped 05/20/16 1450)  ondansetron (ZOFRAN) injection 4 mg (4 mg Intravenous Given 05/20/16 1340)  fentaNYL (SUBLIMAZE) injection 100 mcg (100 mcg Intravenous Given 05/20/16 1340)  morphine 4 MG/ML injection 4 mg (4 mg Intravenous Given 05/20/16 1421)  iopamidol (ISOVUE-300) 61 % injection 100 mL (100 mLs Intravenous Contrast Given 05/20/16 1424)  morphine 4 MG/ML injection 4 mg (4 mg Intravenous Given 05/20/16 1523)     Initial Impression / Assessment and Plan / ED Course  I have reviewed the triage vital signs and the nursing notes.  Pertinent labs & imaging results that were available during my care of the patient were reviewed by me and considered in my medical decision making (see chart for details).  Clinical Course    This is a 55 y.o. Female who presents to the ED complaining of right upper and lower abdominal pain since last night. Patient reports her pain began last night it has gradually worsened. No colicky pain. She reports pain to her right upper and lower abdomen. No back pain. She reports vomiting last night but none today. No diarrhea. She reports she is being followed by GI for abdominal spasms reported this feels very different. Bentyl has not helped. No treatments prior to arrival today. She reports this morning she had a temperature of 100.6. No antipyretics prior to arrival.  On exam the patient is afebrile nontoxic appearing. She does appear uncomfortable. She has right upper and right lower quadrant tenderness to palpation. Her pain is worse in her right lower quadrant. She is a positive psoas sign. No CVA or flank tenderness.  Patient has a leukocytosis with a white count of 16,600. CMP and lipase are unremarkable. Urinalysis is unremarkable.  CT abdomen and pelvis shows advanced appendicitis with  marked inflammatory change in the right lower quadrant. Probable ruptured appendicitis.  I consulted with general surgeon Dr. Daphine DeutscherMartin who accepted the patient in transfer. [facility]. He agrees with plan for Rocephin and Flagyl for antibiotic coverage.  I spoke with emergency medicine physician Dr. Thayer Ohmhris Tegeler and he was made aware of the patient and accepts the patient in transfer.   Patient agrees with plan.   This patient was discussed with Dr. Radford PaxBeaton who agrees with assessment and plan.   Final Clinical Impressions(s) / ED Diagnoses   Final diagnoses:  Acute appendicitis, unspecified acute appendicitis type    New Prescriptions New Prescriptions   No medications on file     Everlene FarrierWilliam Kyrene Longan, PA-C 05/20/16 1532    Nelva Nayobert Beaton, MD 05/23/16 [phone]

## 2016-05-20 NOTE — ED Triage Notes (Signed)
States she has been having "stomach spasms" all night. She has had test by her MD and is waiting for the results. She vomited last night. Right upper quad pain last night. She has had a negative gallbladder U/S in May.

## 2016-05-20 NOTE — H&P (Signed)
Chief Complaint:  Right lower quadrant pain since yesterday.    History of Present Illness:  Karen Buchanan is an 55 y.o. female who presents with right lower quadrant pain and CT at [facility] suggesting ruptured appendix.  CT reviewed and extraluminal gas seen.  Severe phlegmon and rupture but nothing drainable today.  Admitted to [facility] for management.  Past Medical History:  Diagnosis Date  . Chronic back pain   . Pain management     Past Surgical History:  Procedure Laterality Date  . RHINOPLASTY    . TONSILLECTOMY      Current Facility-Administered Medications  Medication Dose Route Frequency Provider Last Rate Last Dose  . metroNIDAZOLE (FLAGYL) IVPB 500 mg  500 mg Intravenous Once Waynetta Pean, PA-C 100 mL/hr at 05/20/16 1645 500 mg at 05/20/16 1645   Current Outpatient Prescriptions  Medication Sig Dispense Refill  . 5-Hydroxytryptophan (5-HTP PO) Take 3 capsules by mouth at bedtime.     . celecoxib (CELEBREX) 100 MG capsule Take 100 mg by mouth daily.    . cyclobenzaprine (FLEXERIL) 10 MG tablet Take 10 mg by mouth 3 (three) times daily as needed for muscle spasms.    Marland Kitchen dicyclomine (BENTYL) 10 MG capsule Take 10 mg by mouth 4 (four) times daily -  before meals and at bedtime.    . NON FORMULARY Take 1 tablet by mouth at bedtime. Kavinace    . oxyCODONE-acetaminophen (PERCOCET/ROXICET) 5-325 MG tablet Take by mouth every 4 (four) hours as needed for severe pain.    Marland Kitchen PRESCRIPTION MEDICATION Apply 1 application topically daily. Bi-Est Cream    . PRESCRIPTION MEDICATION Apply 1 application topically daily. Progestorone Cream    . testosterone (ANDROGEL) 50 MG/5GM (1%) GEL Place 5 g onto the skin daily.    . clindamycin (CLEOCIN) 300 MG capsule Take 1 capsule (300 mg total) by mouth 3 (three) times daily. 30 capsule 0  . mupirocin cream (BACTROBAN) 2 % Apply 1 application topically 2 (two) times daily. 15 g 0   Penicillins No family history on file. Social History:    reports that she has never smoked. She has never used smokeless tobacco. She reports that she drinks alcohol. She reports that she does not use drugs.   REVIEW OF SYSTEMS : Negative except for she is being evaluated for abdominal pain by GI-chronic over the last several months  Physical Exam:   Blood pressure 128/73, pulse 81, temperature 99.1 F (37.3 C), resp. rate 18, height '5\' 5"'  (1.651 m), weight 56.7 kg (125 lb), SpO2 100 %. Body mass index is 20.8 kg/m.  Gen:  WDWN WF guarding in the right lower quadrant  Neurological: Alert and oriented to person, place, and time. Motor and sensory function is grossly intact  Head: Normocephalic and atraumatic.  Eyes: Conjunctivae are normal. Pupils are equal, round, and reactive to light. No scleral icterus.  Neck: Normal range of motion. Neck supple. No tracheal deviation or thyromegaly present.  Cardiovascular:  SR without murmurs or gallops.  No carotid bruits Breast:  Not examined Respiratory: Effort normal.  No respiratory distress. No chest wall tenderness. Breath sounds normal.  No wheezes, rales or rhonchi.  Abdomen:  Guarding and tender in the right lower quadrant GU:  Not examined Musculoskeletal: Normal range of motion. Extremities are nontender. No cyanosis, edema or clubbing noted Lymphadenopathy: No cervical, preauricular, postauricular or axillary adenopathy is present Skin: Skin is warm and dry. No rash noted. No diaphoresis. No erythema. No  pallor. Pscyh: Normal mood and affect. Behavior is normal. Judgment and thought content normal.   LABORATORY RESULTS: Results for orders placed or performed during the hospital encounter of 05/20/16 (from the past 48 hour(s))  Comprehensive metabolic panel     Status: Abnormal   Collection Time: 05/20/16  1:15 PM  Result Value Ref Range   Sodium 136 135 - 145 mmol/L   Potassium 4.1 3.5 - 5.1 mmol/L   Chloride 104 101 - 111 mmol/L   CO2 25 22 - 32 mmol/L   Glucose, Bld 128 (H) 65 - 99  mg/dL   BUN 11 6 - 20 mg/dL   Creatinine, Ser 0.70 0.44 - 1.00 mg/dL   Calcium 9.1 8.9 - 10.3 mg/dL   Total Protein 7.0 6.5 - 8.1 g/dL   Albumin 4.2 3.5 - 5.0 g/dL   AST 25 15 - 41 U/L   ALT 17 14 - 54 U/L   Alkaline Phosphatase 45 38 - 126 U/L   Total Bilirubin 0.9 0.3 - 1.2 mg/dL   GFR calc non Af Amer >60 >60 mL/min   GFR calc Af Amer >60 >60 mL/min    Comment: (NOTE) The eGFR has been calculated using the CKD EPI equation. This calculation has not been validated in all clinical situations. eGFR's persistently <60 mL/min signify possible Chronic Kidney Disease.    Anion gap 7 5 - 15  Lipase, blood     Status: None   Collection Time: 05/20/16  1:15 PM  Result Value Ref Range   Lipase 23 11 - 51 U/L  CBC with Differential     Status: Abnormal   Collection Time: 05/20/16  1:15 PM  Result Value Ref Range   WBC 16.6 (H) 4.0 - 10.5 K/uL   RBC 4.76 3.87 - 5.11 MIL/uL   Hemoglobin 14.5 12.0 - 15.0 g/dL   HCT 42.8 36.0 - 46.0 %   MCV 89.9 78.0 - 100.0 fL   MCH 30.5 26.0 - 34.0 pg   MCHC 33.9 30.0 - 36.0 g/dL   RDW 14.1 11.5 - 15.5 %   Platelets 197 150 - 400 K/uL   Neutrophils Relative % 88 %   Neutro Abs 14.5 (H) 1.7 - 7.7 K/uL   Lymphocytes Relative 5 %   Lymphs Abs 0.9 0.7 - 4.0 K/uL   Monocytes Relative 7 %   Monocytes Absolute 1.1 (H) 0.1 - 1.0 K/uL   Eosinophils Relative 0 %   Eosinophils Absolute 0.0 0.0 - 0.7 K/uL   Basophils Relative 0 %   Basophils Absolute 0.0 0.0 - 0.1 K/uL  Urinalysis, Routine w reflex microscopic     Status: Abnormal   Collection Time: 05/20/16  1:35 PM  Result Value Ref Range   Color, Urine YELLOW YELLOW   APPearance CLEAR CLEAR   Specific Gravity, Urine 1.013 1.005 - 1.030   pH 7.0 5.0 - 8.0   Glucose, UA NEGATIVE NEGATIVE mg/dL   Hgb urine dipstick NEGATIVE NEGATIVE   Bilirubin Urine NEGATIVE NEGATIVE   Ketones, ur 15 (A) NEGATIVE mg/dL   Protein, ur NEGATIVE NEGATIVE mg/dL   Nitrite NEGATIVE NEGATIVE   Leukocytes, UA NEGATIVE  NEGATIVE    Comment: MICROSCOPIC NOT DONE ON URINES WITH NEGATIVE PROTEIN, BLOOD, LEUKOCYTES, NITRITE, OR GLUCOSE <1000 mg/dL.     RADIOLOGY RESULTS: Ct Abdomen Pelvis W Contrast  Result Date: 05/20/2016 CLINICAL DATA:  Nausea, vomiting and right lower quadrant pain. Fever. Symptoms began last night. EXAM: CT ABDOMEN AND PELVIS WITH CONTRAST TECHNIQUE: Multidetector  CT imaging of the abdomen and pelvis was performed using the standard protocol following bolus administration of intravenous contrast. CONTRAST:  123m ISOVUE-300 IOPAMIDOL (ISOVUE-300) INJECTION 61% COMPARISON:  Ultrasound 10/16/2015 FINDINGS: Lower chest: Lung bases are clear.  No pleural or pericardial fluid. Hepatobiliary: 3 mm cyst at the right dome. No other liver finding. No calcified gallstones. Pancreas: Normal Spleen: Normal Adrenals/Urinary Tract: Adrenal glands are normal. Kidneys are normal. Stomach/Bowel: There is an advanced inflammatory process in the right lower quadrant which represents acute appendicitis with rupture and pronounced regional phlegmonous inflammatory change. Probable appendicolith. I do not see any low-density drainable collection. No bowel obstruction Vascular/Lymphatic: Aorta and IVC are normal. No retroperitoneal mass or lymphadenopathy. Reproductive: Uterus and adnexal regions are normal. Other: None significant Musculoskeletal: Mild degenerative changes at the thoracolumbar junction region. IMPRESSION: Advanced appendicitis with marked phlegmonous inflammatory change in the right lower quadrant. Probable ruptured appendicitis. No focal drainable collection. Electronically Signed   By: MNelson ChimesM.D.   On: 05/20/2016 14:45    Problem List: Patient Active Problem List   Diagnosis Date Noted  . Ruptured appendix Oct 2017 05/20/2016    Assessment & Plan: CT suggests perforated appendix with phlegmon.  Will admit for IV antibiotics and reassess for possible percutaneous drainage.      Matt B.  MHassell Done MD, FSent[facility] [phone] 05/20/2016 5:08 PM

## 2016-05-21 ENCOUNTER — Encounter (HOSPITAL_COMMUNITY)
Admission: EM | Disposition: A | Discharge status: Home / Self Care | Payer: Self-pay | Source: Home / Self Care | Attending: Surgery

## 2016-05-21 ENCOUNTER — Inpatient Hospital Stay (HOSPITAL_COMMUNITY): Payer: BLUE CROSS/BLUE SHIELD | Admitting: Anesthesiology

## 2016-05-21 ENCOUNTER — Encounter (HOSPITAL_COMMUNITY): Payer: Self-pay | Admitting: Certified Registered Nurse Anesthetist

## 2016-05-21 HISTORY — PX: LAPAROSCOPIC APPENDECTOMY: SHX408

## 2016-05-21 LAB — CBC
HEMATOCRIT: 40.7 % (ref 36.0–46.0)
Hemoglobin: 13.5 g/dL (ref 12.0–15.0)
MCH: 30.1 pg (ref 26.0–34.0)
MCHC: 33.2 g/dL (ref 30.0–36.0)
MCV: 90.6 fL (ref 78.0–100.0)
PLATELETS: 174 10*3/uL (ref 150–400)
RBC: 4.49 MIL/uL (ref 3.87–5.11)
RDW: 14.4 % (ref 11.5–15.5)
WBC: 14.2 10*3/uL — AB (ref 4.0–10.5)

## 2016-05-21 LAB — COMPREHENSIVE METABOLIC PANEL
ALBUMIN: 3 g/dL — AB (ref 3.5–5.0)
ALT: 13 U/L — AB (ref 14–54)
AST: 22 U/L (ref 15–41)
Alkaline Phosphatase: 27 U/L — ABNORMAL LOW (ref 38–126)
Anion gap: 3 — ABNORMAL LOW (ref 5–15)
BILIRUBIN TOTAL: 0.6 mg/dL (ref 0.3–1.2)
BUN: 13 mg/dL (ref 6–20)
CHLORIDE: 107 mmol/L (ref 101–111)
CO2: 25 mmol/L (ref 22–32)
CREATININE: 0.8 mg/dL (ref 0.44–1.00)
Calcium: 7.5 mg/dL — ABNORMAL LOW (ref 8.9–10.3)
GFR calc Af Amer: >60 mL/min (ref >60)
GLUCOSE: 137 mg/dL — AB (ref 65–99)
Potassium: 3.8 mmol/L (ref 3.5–5.1)
Sodium: 135 mmol/L (ref 135–145)
Total Protein: 5.5 g/dL — ABNORMAL LOW (ref 6.5–8.1)

## 2016-05-21 LAB — CBC WITH DIFFERENTIAL/PLATELET
BASOS ABS: 0 10*3/uL (ref 0.0–0.1)
Basophils Relative: 0 %
EOS PCT: 0 %
Eosinophils Absolute: 0 10*3/uL (ref 0.0–0.7)
HEMATOCRIT: 38.4 % (ref 36.0–46.0)
HEMOGLOBIN: 12.9 g/dL (ref 12.0–15.0)
LYMPHS PCT: 6 %
Lymphs Abs: 0.7 10*3/uL (ref 0.7–4.0)
MCH: 30 pg (ref 26.0–34.0)
MCHC: 33.6 g/dL (ref 30.0–36.0)
MCV: 89.3 fL (ref 78.0–100.0)
Monocytes Absolute: 0.7 10*3/uL (ref 0.1–1.0)
Monocytes Relative: 6 %
NEUTROS ABS: 10.8 10*3/uL — AB (ref 1.7–7.7)
NEUTROS PCT: 88 %
PLATELETS: 161 10*3/uL (ref 150–400)
RBC: 4.3 MIL/uL (ref 3.87–5.11)
RDW: 14.2 % (ref 11.5–15.5)
WBC: 12.2 10*3/uL — AB (ref 4.0–10.5)

## 2016-05-21 LAB — LACTIC ACID, PLASMA: Lactic Acid, Venous: 1.3 mmol/L (ref 0.5–1.9)

## 2016-05-21 SURGERY — APPENDECTOMY, LAPAROSCOPIC
Anesthesia: General | Site: Abdomen

## 2016-05-21 MED ORDER — SCOPOLAMINE 1 MG/3DAYS TD PT72
MEDICATED_PATCH | TRANSDERMAL | Status: AC
  Filled 2016-05-21: qty 1

## 2016-05-21 MED ORDER — LACTATED RINGERS IR SOLN
Status: DC | PRN
  Administered 2016-05-21: 3000 mL

## 2016-05-21 MED ORDER — ACETAMINOPHEN 325 MG PO TABS
650.0000 mg | ORAL_TABLET | Freq: Four times a day (QID) | ORAL | Status: DC | PRN
  Administered 2016-05-21: 650 mg via ORAL
  Filled 2016-05-21: qty 2

## 2016-05-21 MED ORDER — PHENYLEPHRINE 40 MCG/ML (10ML) SYRINGE FOR IV PUSH (FOR BLOOD PRESSURE SUPPORT)
PREFILLED_SYRINGE | INTRAVENOUS | Status: DC | PRN
  Administered 2016-05-21 (×3): 80 ug via INTRAVENOUS
  Administered 2016-05-21: 120 ug via INTRAVENOUS

## 2016-05-21 MED ORDER — SODIUM CHLORIDE 0.9 % IJ SOLN
INTRAMUSCULAR | Status: DC | PRN
  Administered 2016-05-21: 10 mL

## 2016-05-21 MED ORDER — PHENYLEPHRINE 40 MCG/ML (10ML) SYRINGE FOR IV PUSH (FOR BLOOD PRESSURE SUPPORT)
PREFILLED_SYRINGE | INTRAVENOUS | Status: AC
  Filled 2016-05-21: qty 20

## 2016-05-21 MED ORDER — SCOPOLAMINE 1 MG/3DAYS TD PT72
MEDICATED_PATCH | TRANSDERMAL | Status: DC | PRN
  Administered 2016-05-21: 1 via TRANSDERMAL

## 2016-05-21 MED ORDER — FENTANYL CITRATE (PF) 100 MCG/2ML IJ SOLN
INTRAMUSCULAR | Status: DC | PRN
  Administered 2016-05-21: 100 ug via INTRAVENOUS
  Administered 2016-05-21: 50 ug via INTRAVENOUS

## 2016-05-21 MED ORDER — SUGAMMADEX SODIUM 200 MG/2ML IV SOLN
INTRAVENOUS | Status: DC | PRN
  Administered 2016-05-21: 200 mg via INTRAVENOUS

## 2016-05-21 MED ORDER — MIDAZOLAM HCL 5 MG/5ML IJ SOLN
INTRAMUSCULAR | Status: DC | PRN
  Administered 2016-05-21: 2 mg via INTRAVENOUS

## 2016-05-21 MED ORDER — HYDROMORPHONE HCL 1 MG/ML IJ SOLN
0.2500 mg | INTRAMUSCULAR | Status: DC | PRN
  Administered 2016-05-21 (×2): 0.5 mg via INTRAVENOUS

## 2016-05-21 MED ORDER — ALBUMIN HUMAN 5 % IV SOLN
INTRAVENOUS | Status: AC
  Filled 2016-05-21: qty 500

## 2016-05-21 MED ORDER — PROPOFOL 10 MG/ML IV BOLUS
INTRAVENOUS | Status: AC
  Filled 2016-05-21: qty 20

## 2016-05-21 MED ORDER — PIPERACILLIN-TAZOBACTAM 3.375 G IVPB
INTRAVENOUS | Status: AC
  Filled 2016-05-21: qty 50

## 2016-05-21 MED ORDER — BUPIVACAINE LIPOSOME 1.3 % IJ SUSP
20.0000 mL | Freq: Once | INTRAMUSCULAR | Status: AC
  Administered 2016-05-21: 20 mL
  Filled 2016-05-21: qty 20

## 2016-05-21 MED ORDER — ONDANSETRON HCL 4 MG/2ML IJ SOLN
INTRAMUSCULAR | Status: AC
  Filled 2016-05-21: qty 2

## 2016-05-21 MED ORDER — CALCIUM CHLORIDE 10 % IV SOLN
INTRAVENOUS | Status: AC
  Filled 2016-05-21: qty 10

## 2016-05-21 MED ORDER — FENTANYL CITRATE (PF) 100 MCG/2ML IJ SOLN
INTRAMUSCULAR | Status: AC
  Filled 2016-05-21: qty 4

## 2016-05-21 MED ORDER — PROPOFOL 10 MG/ML IV BOLUS
INTRAVENOUS | Status: DC | PRN
  Administered 2016-05-21: 110 mg via INTRAVENOUS

## 2016-05-21 MED ORDER — ROCURONIUM BROMIDE 10 MG/ML (PF) SYRINGE
PREFILLED_SYRINGE | INTRAVENOUS | Status: DC | PRN
  Administered 2016-05-21: 40 mg via INTRAVENOUS

## 2016-05-21 MED ORDER — LIDOCAINE 2% (20 MG/ML) 5 ML SYRINGE
INTRAMUSCULAR | Status: AC
  Filled 2016-05-21: qty 5

## 2016-05-21 MED ORDER — HYDROMORPHONE HCL 1 MG/ML IJ SOLN
INTRAMUSCULAR | Status: AC
  Administered 2016-05-21: 0.5 mg via INTRAVENOUS
  Filled 2016-05-21: qty 1

## 2016-05-21 MED ORDER — LACTATED RINGERS IV SOLN
INTRAVENOUS | Status: DC
  Administered 2016-05-21: 1000 mL via INTRAVENOUS

## 2016-05-21 MED ORDER — MIDAZOLAM HCL 2 MG/2ML IJ SOLN
INTRAMUSCULAR | Status: AC
  Filled 2016-05-21: qty 2

## 2016-05-21 MED ORDER — ONDANSETRON HCL 4 MG/2ML IJ SOLN
INTRAMUSCULAR | Status: DC | PRN
  Administered 2016-05-21: 4 mg via INTRAVENOUS

## 2016-05-21 MED ORDER — LIDOCAINE 2% (20 MG/ML) 5 ML SYRINGE
INTRAMUSCULAR | Status: DC | PRN
  Administered 2016-05-21: 60 mg via INTRAVENOUS

## 2016-05-21 MED ORDER — SUCCINYLCHOLINE CHLORIDE 20 MG/ML IJ SOLN
INTRAMUSCULAR | Status: DC | PRN
  Administered 2016-05-21: 60 mg via INTRAVENOUS

## 2016-05-21 MED ORDER — DEXAMETHASONE SODIUM PHOSPHATE 10 MG/ML IJ SOLN
INTRAMUSCULAR | Status: AC
  Filled 2016-05-21: qty 1

## 2016-05-21 MED ORDER — LACTATED RINGERS IV SOLN
INTRAVENOUS | Status: DC | PRN
  Administered 2016-05-21: 14:00:00 via INTRAVENOUS

## 2016-05-21 MED ORDER — 0.9 % SODIUM CHLORIDE (POUR BTL) OPTIME
TOPICAL | Status: DC | PRN
  Administered 2016-05-21: 1000 mL

## 2016-05-21 MED ORDER — SODIUM CHLORIDE 0.9 % IJ SOLN
INTRAMUSCULAR | Status: AC
  Filled 2016-05-21: qty 10

## 2016-05-21 MED ORDER — PROMETHAZINE HCL 25 MG/ML IJ SOLN: 6.2500 mg | INTRAMUSCULAR | Status: DC | PRN

## 2016-05-21 MED ORDER — SUGAMMADEX SODIUM 200 MG/2ML IV SOLN
INTRAVENOUS | Status: AC
  Filled 2016-05-21: qty 2

## 2016-05-21 MED FILL — Fentanyl Citrate Preservative Free (PF) Inj 100 MCG/2ML: INTRAMUSCULAR | Qty: 2 | Status: AC

## 2016-05-21 SURGICAL SUPPLY — 41 items
APPLIER CLIP ROT 10 11.4 M/L (STAPLE)
CABLE HIGH FREQUENCY MONO STRZ (ELECTRODE) ×2 IMPLANT
CHLORAPREP W/TINT 26ML (MISCELLANEOUS) ×2 IMPLANT
CLIP APPLIE ROT 10 11.4 M/L (STAPLE) IMPLANT
COVER SURGICAL LIGHT HANDLE (MISCELLANEOUS) ×2 IMPLANT
CUTTER FLEX LINEAR 45M (STAPLE) ×2 IMPLANT
DECANTER SPIKE VIAL GLASS SM (MISCELLANEOUS) ×2 IMPLANT
DERMABOND ADVANCED (GAUZE/BANDAGES/DRESSINGS) ×1
DERMABOND ADVANCED .7 DNX12 (GAUZE/BANDAGES/DRESSINGS) ×1 IMPLANT
DRAIN CHANNEL 19F RND (DRAIN) ×2 IMPLANT
DRAPE LAPAROSCOPIC ABDOMINAL (DRAPES) ×2 IMPLANT
DRSG TEGADERM 4X4.75 (GAUZE/BANDAGES/DRESSINGS) ×2 IMPLANT
ELECT REM PT RETURN 9FT ADLT (ELECTROSURGICAL) ×2
ELECTRODE REM PT RTRN 9FT ADLT (ELECTROSURGICAL) ×1 IMPLANT
ENDOLOOP SUT PDS II  0 18 (SUTURE)
ENDOLOOP SUT PDS II 0 18 (SUTURE) IMPLANT
EVACUATOR SILICONE 100CC (DRAIN) ×2 IMPLANT
GLOVE BIOGEL M 8.0 STRL (GLOVE) ×2 IMPLANT
GOWN STRL REUS W/TWL XL LVL3 (GOWN DISPOSABLE) ×4 IMPLANT
IRRIG SUCT STRYKERFLOW 2 WTIP (MISCELLANEOUS) ×2
IRRIGATION SUCT STRKRFLW 2 WTP (MISCELLANEOUS) ×1 IMPLANT
KIT BASIN OR (CUSTOM PROCEDURE TRAY) ×2 IMPLANT
POUCH RETRIEVAL ECOSAC 10 (ENDOMECHANICALS) IMPLANT
POUCH RETRIEVAL ECOSAC 10MM (ENDOMECHANICALS)
POUCH SPECIMEN RETRIEVAL 10MM (ENDOMECHANICALS) ×2 IMPLANT
RELOAD 45 VASCULAR/THIN (ENDOMECHANICALS) ×2 IMPLANT
RELOAD STAPLE TA45 3.5 REG BLU (ENDOMECHANICALS) IMPLANT
SCISSORS LAP 5X45 EPIX DISP (ENDOMECHANICALS) ×2 IMPLANT
SHEARS HARMONIC ACE PLUS 45CM (MISCELLANEOUS) ×2 IMPLANT
SLEEVE XCEL OPT CAN 5 100 (ENDOMECHANICALS) ×2 IMPLANT
STAPLER VISISTAT 35W (STAPLE) IMPLANT
SUT ETHILON 3 0 PS 1 (SUTURE) ×2 IMPLANT
SUT VIC AB 4-0 SH 18 (SUTURE) ×2 IMPLANT
TOWEL OR 17X26 10 PK STRL BLUE (TOWEL DISPOSABLE) ×2 IMPLANT
TRAY FOLEY W/METER SILVER 14FR (SET/KITS/TRAYS/PACK) ×2 IMPLANT
TRAY FOLEY W/METER SILVER 16FR (SET/KITS/TRAYS/PACK) IMPLANT
TRAY LAPAROSCOPIC (CUSTOM PROCEDURE TRAY) ×2 IMPLANT
TROCAR BLADELESS OPT 5 100 (ENDOMECHANICALS) ×2 IMPLANT
TROCAR XCEL BLUNT TIP 100MML (ENDOMECHANICALS) ×2 IMPLANT
TROCAR XCEL NON-BLD 11X100MML (ENDOMECHANICALS) IMPLANT
TUBING INSUF HEATED (TUBING) ×2 IMPLANT

## 2016-05-21 NOTE — Anesthesia Preprocedure Evaluation (Signed)
Anesthesia Evaluation  Patient identified by MRN, date of birth, ID band Patient awake    Reviewed: Allergy & Precautions, NPO status , Patient's Chart, lab work & pertinent test results  History of Anesthesia Complications Negative for: history of anesthetic complications  Airway Mallampati: II  TM Distance: >3 FB Neck ROM: Full    Dental no notable dental hx. (+) Teeth Intact, Dental Advisory Given   Pulmonary neg pulmonary ROS,    Pulmonary exam normal        Cardiovascular negative cardio ROS Normal cardiovascular exam     Neuro/Psych negative neurological ROS  negative psych ROS   GI/Hepatic Neg liver ROS,   Endo/Other  negative endocrine ROS  Renal/GU negative Renal ROS     Musculoskeletal   Abdominal   Peds  Hematology   Anesthesia Other Findings   Reproductive/Obstetrics                             Anesthesia Physical Anesthesia Plan  ASA: II and emergent  Anesthesia Plan: General   Post-op Pain Management:    Induction: Intravenous  Airway Management Planned: Oral ETT  Additional Equipment:   Intra-op Plan:   Post-operative Plan: Extubation in OR  Informed Consent:   Plan Discussed with:   Anesthesia Plan Comments:         Anesthesia Quick Evaluation

## 2016-05-21 NOTE — Anesthesia Procedure Notes (Signed)
Procedure Name: Intubation Performed by: Khushbu Pippen J Pre-anesthesia Checklist: Patient identified, Emergency Drugs available, Suction available, Patient being monitored and Timeout performed Patient Re-evaluated:Patient Re-evaluated prior to inductionOxygen Delivery Method: Circle system utilized Preoxygenation: Pre-oxygenation with 100% oxygen Intubation Type: IV induction Ventilation: Mask ventilation without difficulty Laryngoscope Size: Mac and 3 Grade View: Grade I Tube type: Oral Tube size: 7.0 mm Number of attempts: 1 Airway Equipment and Method: Stylet Placement Confirmation: ETT inserted through vocal cords under direct vision,  positive ETCO2,  CO2 detector and breath sounds checked- equal and bilateral Secured at: 21 cm Tube secured with: Tape Dental Injury: Teeth and Oropharynx as per pre-operative assessment        

## 2016-05-21 NOTE — Interval H&P Note (Signed)
History and Physical Interval Note:  05/21/2016 1:43 PM  Karen Buchanan  has presented today for surgery, with the diagnosis of appendicitis  The various methods of treatment have been discussed with the patient and family. After consideration of risks, benefits and other options for treatment, the patient has consented to  Procedure(s): APPENDECTOMY LAPAROSCOPIC (N/A) as a surgical intervention .  The patient's history has been reviewed, patient examined, no change in status, stable for surgery.  I have reviewed the patient's chart and labs.  Questions were answered to the patient's satisfaction.     Boss Danielsen B

## 2016-05-21 NOTE — Progress Notes (Signed)
[facility] Progress Note     Subjective: Fentanyl is controlling abdominal pain. Pain is still worse with movement and inspiration, patient reports she is unable to lift her right leg because it causes severe RLQ pain. Denies fever, chills, nausea, or vomiting.  Her partner is in the room - discussed ruptured appendicitis and possible course of treatment in detail.  Objective: Vital signs in last 24 hours: Temp:  [98.1 F (36.7 C)-99.8 F (37.7 C)] 98.2 F (36.8 C) (10/20 0625) Pulse Rate:  [79-104] 79 (10/20 0625) Resp:  [14-18] 14 (10/20 0625) BP: (108-131)/(64-80) 108/64 (10/20 0625) SpO2:  [94 %-100 %] 94 % (10/20 0625) Weight:  [125 lb (56.7 kg)] 125 lb (56.7 kg) (10/19 1234) Last BM Date: 05/20/16  Intake/Output from previous day: 10/19 0701 - 10/20 0700 In: 616.7 [I.V.:616.7] Out: -  Intake/Output this shift: No intake/output data recorded.  PE: Gen:  Alert,NAD, uncomfortable, cooperative  Pulm:  CTA, no W/R/R Abd: Soft, diffusely tender, worse over RLQ, +rebound tenderness, few BS Ext:  No erythema, edema, or tenderness   Lab Results:   Recent Labs  05/20/16 2212 05/21/16 0533  WBC 10.2 12.2*  HGB 14.4 12.9  HCT 41.8 38.4  PLT 162 161   BMET  Recent Labs  05/20/16 1315 05/20/16 2212 05/21/16 0533  NA 136  --  135  K 4.1  --  3.8  CL 104  --  107  CO2 25  --  25  GLUCOSE 128*  --  137*  BUN 11  --  13  CREATININE 0.70 0.70 0.80  CALCIUM 9.1  --  7.5*   CMP     Component Value Date/Time   NA 135 05/21/2016 0533   K 3.8 05/21/2016 0533   CL 107 05/21/2016 0533   CO2 25 05/21/2016 0533   GLUCOSE 137 (H) 05/21/2016 0533   BUN 13 05/21/2016 0533   CREATININE 0.80 05/21/2016 0533   CALCIUM 7.5 (L) 05/21/2016 0533   PROT 5.5 (L) 05/21/2016 0533   ALBUMIN 3.0 (L) 05/21/2016 0533   AST 22 05/21/2016 0533   ALT 13 (L) 05/21/2016 0533   ALKPHOS 27 (L) 05/21/2016 0533   BILITOT 0.6 05/21/2016 0533   GFRNONAA >60 05/21/2016 0533    GFRAA >60 05/21/2016 0533   Lipase     Component Value Date/Time   LIPASE 23 05/20/2016 1315   Studies/Results: Ct Abdomen Pelvis W Contrast  Result Date: 05/20/2016 CLINICAL DATA:  Nausea, vomiting and right lower quadrant pain. Fever. Symptoms began last night. EXAM: CT ABDOMEN AND PELVIS WITH CONTRAST TECHNIQUE: Multidetector CT imaging of the abdomen and pelvis was performed using the standard protocol following bolus administration of intravenous contrast. CONTRAST:  ISOVUE-300 IOPAMIDOL (ISOVUE-300) INJECTION 61% COMPARISON:  Ultrasound 10/16/2015 FINDINGS: Lower chest: Lung bases are clear.  No pleural or pericardial fluid. Hepatobiliary: 3 mm cyst at the right dome. No other liver finding. No calcified gallstones. Pancreas: Normal Spleen: Normal Adrenals/Urinary Tract: Adrenal glands are normal. Kidneys are normal. Stomach/Bowel: There is an advanced inflammatory process in the right lower quadrant which represents acute appendicitis with rupture and pronounced regional phlegmonous inflammatory change. Probable appendicolith. I do not see any low-density drainable collection. No bowel obstruction Vascular/Lymphatic: Aorta and IVC are normal. No retroperitoneal mass or lymphadenopathy. Reproductive: Uterus and adnexal regions are normal. Other: None significant Musculoskeletal: Mild degenerative changes at the thoracolumbar junction region. IMPRESSION: Advanced appendicitis with marked phlegmonous inflammatory change in the right lower quadrant. Probable ruptured appendicitis. No  focal drainable collection. Electronically Signed   By: Paulina FusiMark  Shogry M.D.   On: 05/20/2016 14:45   Anti-infectives: Anti-infectives    Start     Dose/Rate Route Frequency Ordered Stop   05/21/16 0600  piperacillin-tazobactam (ZOSYN) IVPB 3.375 g     3.375 g 12.5 mL/hr over 240 Minutes Intravenous Every 8 hours 05/20/16 2215     05/20/16 2300  piperacillin-tazobactam (ZOSYN) IVPB 3.375 g     3.375 g 100  mL/hr over 30 Minutes Intravenous  Once 05/20/16 2215 05/21/16 0020   05/20/16 2145  cefTRIAXone (ROCEPHIN) 1 g in dextrose 5 % 50 mL IVPB  Status:  Discontinued     1 g 100 mL/hr over 30 Minutes Intravenous  Once 05/20/16 2143 05/20/16 2215   05/20/16 2145  piperacillin-tazobactam (ZOSYN) IVPB 3.375 g  Status:  Discontinued     3.375 g 12.5 mL/hr over 240 Minutes Intravenous Every 8 hours 05/20/16 2143 05/20/16 2215   05/20/16 1530  cefTRIAXone (ROCEPHIN) 1 g in dextrose 5 % 50 mL IVPB     1 g 100 mL/hr over 30 Minutes Intravenous  Once 05/20/16 1518 05/20/16 1649   05/20/16 1530  metroNIDAZOLE (FLAGYL) IVPB 500 mg     500 mg 100 mL/hr over 60 Minutes Intravenous  Once 05/20/16 1518 05/20/16 1817     Assessment/Plan Acute appendicitis with rupture - WBC 12 from 16 - continue NPO and IV abx - IR consult for recommendations/possible drainage  - IS  - pain control  FEN: NPO, will advance to sips/clears if no procedure today VTE: [place] heparin SCDs ID: [email or web address]  Dispo: IV abx, bowel rest, IR consult     LOS: 1 day    Adam PhenixElizabeth S Simaan , [facility] 05/21/2016, 8:53 AM Pager: [phone] Consults: [phone] Mon-Fri 7:00 am-4:30 pm Sat-Sun 7:00 am-11:30 am

## 2016-05-21 NOTE — Anesthesia Postprocedure Evaluation (Signed)
Anesthesia Post Note  Patient: Karen Buchanan  Procedure(s) Performed: Procedure(s) (LRB): APPENDECTOMY LAPAROSCOPIC (N/A)  Patient location during evaluation: PACU Anesthesia Type: General Level of consciousness: sedated Pain management: pain level controlled Vital Signs Assessment: post-procedure vital signs reviewed and stable Respiratory status: spontaneous breathing and respiratory function stable Cardiovascular status: stable Anesthetic complications: no    Last Vitals:  Vitals:   05/21/16 1615 05/21/16 1630  BP: 112/70 111/69  Pulse: 80 76  Resp: 19 15  Temp:      Last Pain:  Vitals:   05/21/16 1630  TempSrc:   PainSc: 0-No pain                 Sonnie Pawloski DANIEL

## 2016-05-21 NOTE — Progress Notes (Signed)
Progress note: appreciate Dr. Val EagleWegner reviewing CT scan. No drainable fluid collection at this time. Per Dr. Daphine DeutscherMartin, proceed with non-operative management today: IV antibiotics, IV fluids, and bowel rest (limited sips/chips). Re-evaluate in AM.  Hosie SpangleElizabeth Daijha Leggio, [facility] Pager: [phone] Consults: [phone] Mon-Fri 7:00 am-4:30 pm Sat-Sun 7:00 am-11:30 am

## 2016-05-21 NOTE — Transfer of Care (Signed)
Immediate Anesthesia Transfer of Care Note  Patient: Karen Buchanan  Procedure(s) Performed: Procedure(s): APPENDECTOMY LAPAROSCOPIC (N/A)  Patient Location: PACU  Anesthesia Type:General  Level of Consciousness: sedated, patient cooperative and responds to stimulation  Airway & Oxygen Therapy: Patient Spontanous Breathing and Patient connected to face mask oxygen  Post-op Assessment: Report given to RN and Post -op Vital signs reviewed and stable  Post vital signs: Reviewed and stable  Last Vitals:  Vitals:   05/21/16 1100 05/21/16 1236  BP:    Pulse:    Resp:    Temp: (!) 38.4 C 37.3 C    Last Pain:  Vitals:   05/21/16 1353  TempSrc:   PainSc: 3       Patients Stated Pain Goal: 4 (05/21/16 1353)  Complications: No apparent anesthesia complications

## 2016-05-21 NOTE — Op Note (Signed)
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  After anesthesia was administered and the patient was prepped a timeout was performed.  Access was achieved via a Hasson technique through the umbilicus.  Two 5 mm trocars were placed in the right upper quadrant and left lower quadrant.  The abdomen had bloody/cloudy fluid in the pelvis and right lower quadrant.  There was no omentum walling off the right lower quadrant and surveying the transverse colon showed this to be hypoplastic.  The phlegmon was broken up with the sucker controlling some very feculent material along the shaft of the appendix.  The mesentery of the appendix was transected with the Harmonic scalpel to allow isolation of the base of the appendix.  This was transected with the Ethicon Endo GIA 4.5 with a vascular load.  I has sucked out about 100 cc of purulent exudate and then proceeded to irrigate the entire abdomen with 3 liters of saline.  This included the pelvis and the right and left hemidiaphragms.  A 19 blake drain was inserted and brought out through the left lower quadrant trocar.  The umbilicus was repaired with a figure of 8 of 0 vicryl.  Exparel diluted to 30 cc was injected in the port sites.  The two other port sites were closed with 4-0 monocryl.    The patient tolerated the procedure well and was taken to the PACU in stable condition.     Matt B. Daphine DeutscherMartin, MD, [facility], [place] [phone]

## 2016-05-22 LAB — BASIC METABOLIC PANEL
Anion gap: 6 (ref 5–15)
BUN: 14 mg/dL (ref 6–20)
CHLORIDE: 105 mmol/L (ref 101–111)
CO2: 25 mmol/L (ref 22–32)
CREATININE: 0.74 mg/dL (ref 0.44–1.00)
Calcium: 7.7 mg/dL — ABNORMAL LOW (ref 8.9–10.3)
GFR calc Af Amer: >60 mL/min (ref >60)
GFR calc non Af Amer: >60 mL/min (ref >60)
Glucose, Bld: 138 mg/dL — ABNORMAL HIGH (ref 65–99)
POTASSIUM: 4 mmol/L (ref 3.5–5.1)
SODIUM: 136 mmol/L (ref 135–145)

## 2016-05-22 LAB — CBC WITH DIFFERENTIAL/PLATELET
Basophils Absolute: 0 10*3/uL (ref 0.0–0.1)
Basophils Relative: 0 %
EOS ABS: 0 10*3/uL (ref 0.0–0.7)
Eosinophils Relative: 0 %
HEMATOCRIT: 35.6 % — AB (ref 36.0–46.0)
HEMOGLOBIN: 11.8 g/dL — AB (ref 12.0–15.0)
LYMPHS ABS: 0.8 10*3/uL (ref 0.7–4.0)
Lymphocytes Relative: 7 %
MCH: 30.2 pg (ref 26.0–34.0)
MCHC: 33.1 g/dL (ref 30.0–36.0)
MCV: 91 fL (ref 78.0–100.0)
MONOS PCT: 5 %
Monocytes Absolute: 0.5 10*3/uL (ref 0.1–1.0)
NEUTROS ABS: 10.4 10*3/uL — AB (ref 1.7–7.7)
NEUTROS PCT: 88 %
Platelets: 151 10*3/uL (ref 150–400)
RBC: 3.91 MIL/uL (ref 3.87–5.11)
RDW: 14.7 % (ref 11.5–15.5)
WBC: 11.7 10*3/uL — AB (ref 4.0–10.5)

## 2016-05-22 MED ORDER — LIP MEDEX EX OINT
TOPICAL_OINTMENT | CUTANEOUS | Status: AC
  Administered 2016-05-22: 13:00:00
  Filled 2016-05-22: qty 7

## 2016-05-22 MED ORDER — OXYCODONE HCL 5 MG PO TABS
10.0000 mg | ORAL_TABLET | ORAL | Status: DC | PRN
  Administered 2016-05-22 – 2016-05-25 (×8): 10 mg via ORAL
  Filled 2016-05-22 (×9): qty 2

## 2016-05-22 NOTE — Progress Notes (Signed)
A Spiritual Care Consult was placed in Ms Dible's record for assistance with a [place]. I met with Ms Silverthorn and went over the [facility] form, and left it with her to read. She will ask the nurse to page a chaplain when she is ready to execute the form. I am not a Notary, so one will have to be found to complete this task before Monday morning.  Sallee Lange. Abbigail Anstey, [place]

## 2016-05-22 NOTE — Progress Notes (Addendum)
Patient ID: Karen GrammesCheryl D Toran, female   DOB: 02/26/1961, 55 y.o.   MRN: [id number] 1 Day Post-Op  Subjective: Moderate pain, controlled with meds but different pain than preop and overall feels better. Ambulatory. No nausea or vomiting.  Objective: Vital signs in last 24 hours: Temp:  [97.9 F (36.6 C)-101.1 F (38.4 C)] 99.1 F (37.3 C) (10/21 0413) Pulse Rate:  [74-98] 90 (10/21 0413) Resp:  [14-19] 16 (10/21 0413) BP: (93-119)/(48-72) 96/51 (10/21 0413) SpO2:  [97 %-100 %] 100 % (10/21 0413) Last BM Date: 05/20/16  Intake/Output from previous day: 10/20 0701 - 10/21 0700 In: 3103.3 [P.O.:60; I.V.:2693.3; IV Piggyback:350] Out: 705 [Urine:300; Drains:380; Blood:25] Intake/Output this shift: No intake/output data recorded.  General appearance: alert, cooperative and mild distress GI: Mild to moderate diffuse tenderness, more in the lower abdomen. Nondistended. JP drainage is serous sanguinous Incision/Wound: Clean and dry  Lab Results:   Recent Labs  05/21/16 1150 05/22/16 0443  WBC 14.2* 11.7*  HGB 13.5 11.8*  HCT 40.7 35.6*  PLT 174 151   BMET  Recent Labs  05/21/16 0533 05/22/16 0443  NA 135 136  K 3.8 4.0  CL 107 105  CO2 25 25  GLUCOSE 137* 138*  BUN 13 14  CREATININE 0.80 0.74  CALCIUM 7.5* 7.7*     Studies/Results: Ct Abdomen Pelvis W Contrast  Result Date: 05/20/2016 CLINICAL DATA:  Nausea, vomiting and right lower quadrant pain. Fever. Symptoms began last night. EXAM: CT ABDOMEN AND PELVIS WITH CONTRAST TECHNIQUE: Multidetector CT imaging of the abdomen and pelvis was performed using the standard protocol following bolus administration of intravenous contrast. CONTRAST:  100mL ISOVUE-300 IOPAMIDOL (ISOVUE-300) INJECTION 61% COMPARISON:  Ultrasound 10/16/2015 FINDINGS: Lower chest: Lung bases are clear.  No pleural or pericardial fluid. Hepatobiliary: 3 mm cyst at the right dome. No other liver finding. No calcified gallstones. Pancreas: Normal Spleen:  Normal Adrenals/Urinary Tract: Adrenal glands are normal. Kidneys are normal. Stomach/Bowel: There is an advanced inflammatory process in the right lower quadrant which represents acute appendicitis with rupture and pronounced regional phlegmonous inflammatory change. Probable appendicolith. I do not see any low-density drainable collection. No bowel obstruction Vascular/Lymphatic: Aorta and IVC are normal. No retroperitoneal mass or lymphadenopathy. Reproductive: Uterus and adnexal regions are normal. Other: None significant Musculoskeletal: Mild degenerative changes at the thoracolumbar junction region. IMPRESSION: Advanced appendicitis with marked phlegmonous inflammatory change in the right lower quadrant. Probable ruptured appendicitis. No focal drainable collection. Electronically Signed   By: Paulina FusiMark  Shogry M.D.   On: 05/20/2016 14:45    Anti-infectives: Anti-infectives    Start     Dose/Rate Route Frequency Ordered Stop   05/21/16 0600  piperacillin-tazobactam (ZOSYN) IVPB 3.375 g     3.375 g 12.5 mL/hr over 240 Minutes Intravenous Every 8 hours 05/20/16 2215     05/20/16 2300  piperacillin-tazobactam (ZOSYN) IVPB 3.375 g     3.375 g 100 mL/hr over 30 Minutes Intravenous  Once 05/20/16 2215 05/21/16 0020   05/20/16 2145  cefTRIAXone (ROCEPHIN) 1 g in dextrose 5 % 50 mL IVPB  Status:  Discontinued     1 g 100 mL/hr over 30 Minutes Intravenous  Once 05/20/16 2143 05/20/16 2215   05/20/16 2145  piperacillin-tazobactam (ZOSYN) IVPB 3.375 g  Status:  Discontinued     3.375 g 12.5 mL/hr over 240 Minutes Intravenous Every 8 hours 05/20/16 2143 05/20/16 2215   05/20/16 1530  cefTRIAXone (ROCEPHIN) 1 g in dextrose 5 % 50 mL IVPB  1 g 100 mL/hr over 30 Minutes Intravenous  Once 05/20/16 1518 05/20/16 1649   05/20/16 1530  metroNIDAZOLE (FLAGYL) IVPB 500 mg     500 mg 100 mL/hr over 60 Minutes Intravenous  Once 05/20/16 1518 05/20/16 1817      Assessment/Plan: s/p  Procedure(s): APPENDECTOMY LAPAROSCOPIC Stable postoperatively. Continue IV antibiotics. Start clear liquid diet. Ambulation encouraged.   LOS: 2 days    Azzam Mehra T 05/22/2016

## 2016-05-23 LAB — CBC
HEMATOCRIT: 34.9 % — AB (ref 36.0–46.0)
HEMOGLOBIN: 11.6 g/dL — AB (ref 12.0–15.0)
MCH: 30.4 pg (ref 26.0–34.0)
MCHC: 33.2 g/dL (ref 30.0–36.0)
MCV: 91.6 fL (ref 78.0–100.0)
Platelets: 162 10*3/uL (ref 150–400)
RBC: 3.81 MIL/uL — ABNORMAL LOW (ref 3.87–5.11)
RDW: 14.4 % (ref 11.5–15.5)
WBC: 7.8 10*3/uL (ref 4.0–10.5)

## 2016-05-23 NOTE — Progress Notes (Signed)
Patient ID: Marcheta GrammesCheryl D Crow, female   DOB: 01/29/1961, 55 y.o.   MRN: [id number] 2 Days Post-Op  Subjective: Still significant lower abdominal pain requiring IV medications but may be a little better. No nausea. Tolerating clear liquid diet. No flatus or bowel movement yet.  Objective: Vital signs in last 24 hours: Temp:  [97.8 F (36.6 C)-98.9 F (37.2 C)] 98.3 F (36.8 C) (10/22 0521) Pulse Rate:  [81-86] 81 (10/22 0521) Resp:  [14-16] 14 (10/22 0521) BP: (101-107)/(63-67) 107/67 (10/22 0521) SpO2:  [99 %-100 %] 100 % (10/22 0521) Last BM Date: 05/20/16  Intake/Output from previous day: 10/21 0701 - 10/22 0700 In: 2220 [P.O.:120; I.V.:1900; IV Piggyback:200] Out: 1220 [Urine:875; Drains:345] Intake/Output this shift: No intake/output data recorded.  General appearance: alert, cooperative, no distress and appears more comfortable than yesterday GI: abnormal findings:  Moderate lower abdominal tenderness. No distention. Incision/Wound: Clean and dry. JP drainage serous  Lab Results:   Recent Labs  05/22/16 0443 05/23/16 0014  WBC 11.7* 7.8  HGB 11.8* 11.6*  HCT 35.6* 34.9*  PLT 151 162   BMET  Recent Labs  05/21/16 0533 05/22/16 0443  NA 135 136  K 3.8 4.0  CL 107 105  CO2 25 25  GLUCOSE 137* 138*  BUN 13 14  CREATININE 0.80 0.74  CALCIUM 7.5* 7.7*     Studies/Results: No results found.  Anti-infectives: Anti-infectives    Start     Dose/Rate Route Frequency Ordered Stop   05/21/16 0600  piperacillin-tazobactam (ZOSYN) IVPB 3.375 g     3.375 g 12.5 mL/hr over 240 Minutes Intravenous Every 8 hours 05/20/16 2215     05/20/16 2300  piperacillin-tazobactam (ZOSYN) IVPB 3.375 g     3.375 g 100 mL/hr over 30 Minutes Intravenous  Once 05/20/16 2215 05/21/16 0020   05/20/16 2145  cefTRIAXone (ROCEPHIN) 1 g in dextrose 5 % 50 mL IVPB  Status:  Discontinued     1 g 100 mL/hr over 30 Minutes Intravenous  Once 05/20/16 2143 05/20/16 2215   05/20/16 2145   piperacillin-tazobactam (ZOSYN) IVPB 3.375 g  Status:  Discontinued     3.375 g 12.5 mL/hr over 240 Minutes Intravenous Every 8 hours 05/20/16 2143 05/20/16 2215   05/20/16 1530  cefTRIAXone (ROCEPHIN) 1 g in dextrose 5 % 50 mL IVPB     1 g 100 mL/hr over 30 Minutes Intravenous  Once 05/20/16 1518 05/20/16 1649   05/20/16 1530  metroNIDAZOLE (FLAGYL) IVPB 500 mg     500 mg 100 mL/hr over 60 Minutes Intravenous  Once 05/20/16 1518 05/20/16 1817      Assessment/Plan: s/p Procedure(s): APPENDECTOMY LAPAROSCOPIC Perforated appendicitis. White blood count normalized. Continue IV antibiotics. Advanced to full liquid diet.    LOS: 3 days    Reba Hulett T 05/23/2016

## 2016-05-23 NOTE — Progress Notes (Signed)
Chaplain followed up to see if Ms Karen Buchanan was ready to execute a HCPOA. She relates she has not felt well, is not feeling well, and asks that a chaplain visit her on Monday to complete the process.  Benjie Karvonenharles D. Zeba Luby, DMin Chaplain

## 2016-05-24 MED ORDER — FAMOTIDINE 20 MG PO TABS
20.0000 mg | ORAL_TABLET | Freq: Two times a day (BID) | ORAL | Status: DC
  Administered 2016-05-24 – 2016-05-26 (×4): 20 mg via ORAL
  Filled 2016-05-24 (×4): qty 1

## 2016-05-24 NOTE — Progress Notes (Signed)
Pt's significant other and pt were questioning while she needed heparin shots since she was ambulating frequently.  I educated pt, and advised her that it was needed to help prevent DVT. I also mentioned that if she would rather she could ask provider about switching her over to Lovenox.

## 2016-05-24 NOTE — Progress Notes (Signed)
   05/24/16 1000  Clinical Encounter Type  Visited With Patient and family together  Visit Type Follow-up  Referral From Chaplain  Consult/Referral To Chaplain  Stress Factors  Patient Stress Factors Health changes  Family Stress Factors Not reviewed  Advance Directives (For Healthcare)  Does patient have an advance directive? No  CHP followed up with patient and family member [facility] about completing AD .  Patient hopes to complete it before discharge. Patient will ask nurse to request [place] when complete. Rodney BoozeGail L Rillie Riffel 05/24/16

## 2016-05-24 NOTE — Progress Notes (Signed)
Patient ID: Karen GrammesCheryl D Thrun, female   DOB: 09/19/1960, 55 y.o.   MRN: [id number]  [facility]  3 Days Post-Op  Subjective: Slightly better today than yesterday. Still requiring IV pain medication. No flatus or BM but states that she is feeling some movement in her abdomen now. Walked 10+ laps yesterday. Denies n/v.   Objective: Vital signs in last 24 hours: Temp:  [97.8 F (36.6 C)-99.5 F (37.5 C)] 99.5 F (37.5 C) (10/23 0527) Pulse Rate:  [73-81] 73 (10/23 0527) Resp:  [16] 16 (10/23 0527) BP: (105-121)/(64-67) 121/67 (10/23 0527) SpO2:  [100 %] 100 % (10/23 0527) Last BM Date: 05/20/16  Intake/Output from previous day: 10/22 0701 - 10/23 0700 In: 3250 [P.O.:600; I.V.:2400; IV Piggyback:250] Out: 3883 [Urine:3700; Drains:183] Intake/Output this shift: No intake/output data recorded.  PE: Gen:  Alert, NAD, pleasant Card:  RRR Pulm:  CTAB Abd: Soft, mildly distended, hypoactive BS, incisions C/D/I, drain with minimal serosanguinous drainage  Lab Results:   Recent Labs  05/22/16 0443 05/23/16 0014  WBC 11.7* 7.8  HGB 11.8* 11.6*  HCT 35.6* 34.9*  PLT 151 162   BMET  Recent Labs  05/22/16 0443  NA 136  K 4.0  CL 105  CO2 25  GLUCOSE 138*  BUN 14  CREATININE 0.74  CALCIUM 7.7*   PT/INR No results for input(s): LABPROT, INR in the last 72 hours. CMP     Component Value Date/Time   NA 136 05/22/2016 0443   K 4.0 05/22/2016 0443   CL 105 05/22/2016 0443   CO2 25 05/22/2016 0443   GLUCOSE 138 (H) 05/22/2016 0443   BUN 14 05/22/2016 0443   CREATININE 0.74 05/22/2016 0443   CALCIUM 7.7 (L) 05/22/2016 0443   PROT 5.5 (L) 05/21/2016 0533   ALBUMIN 3.0 (L) 05/21/2016 0533   AST 22 05/21/2016 0533   ALT 13 (L) 05/21/2016 0533   ALKPHOS 27 (L) 05/21/2016 0533   BILITOT 0.6 05/21/2016 0533   GFRNONAA >60 05/22/2016 0443   GFRAA >60 05/22/2016 0443   Lipase     Component Value Date/Time   LIPASE 23 05/20/2016 1315        Studies/Results: No results found.  Anti-infectives: Anti-infectives    Start     Dose/Rate Route Frequency Ordered Stop   05/21/16 0600  piperacillin-tazobactam (ZOSYN) IVPB 3.375 g     3.375 g 12.5 mL/hr over 240 Minutes Intravenous Every 8 hours 05/20/16 2215     05/20/16 2300  piperacillin-tazobactam (ZOSYN) IVPB 3.375 g     3.375 g 100 mL/hr over 30 Minutes Intravenous  Once 05/20/16 2215 05/21/16 0020   05/20/16 2145  cefTRIAXone (ROCEPHIN) 1 g in dextrose 5 % 50 mL IVPB  Status:  Discontinued     1 g 100 mL/hr over 30 Minutes Intravenous  Once 05/20/16 2143 05/20/16 2215   05/20/16 2145  piperacillin-tazobactam (ZOSYN) IVPB 3.375 g  Status:  Discontinued     3.375 g 12.5 mL/hr over 240 Minutes Intravenous Every 8 hours 05/20/16 2143 05/20/16 2215   05/20/16 1530  cefTRIAXone (ROCEPHIN) 1 g in dextrose 5 % 50 mL IVPB     1 g 100 mL/hr over 30 Minutes Intravenous  Once 05/20/16 1518 05/20/16 1649   05/20/16 1530  metroNIDAZOLE (FLAGYL) IVPB 500 mg     500 mg 100 mL/hr over 60 Minutes Intravenous  Once 05/20/16 1518 05/20/16 1817       Assessment/Plan s/p Procedure(s): APPENDECTOMY LAPAROSCOPIC 05/21/16 Dr. Daphine DeutscherMartin -  POD 3 - drain 183cc/24hr - no flatus/BM  ID - zosyn day 5 FEN - full liquid VTE - heparin  Plan - continue mobilization. Try to alternate IV and PO pain medication to wean off IV medication. Continue full liquid diet until flatus/BM. Continue drain and IV antibiotics.    LOS: 4 days    Edson Snowball , [facility] 05/24/2016, 7:46 AM Pager: [phone] Consults: [phone] Mon-Fri 7:00 am-4:30 pm Sat-Sun 7:00 am-11:30 am

## 2016-05-24 NOTE — Progress Notes (Signed)
The patient is receiving Pepcid by the intravenous route.  Based on criteria approved by the Pharmacy and Therapeutics Committee and the Medical Executive Committee, the medication is being converted to the equivalent oral dose form.  These criteria include: -No active GI bleeding -Able to tolerate diet of full liquids (or better) or tube feeding -Able to tolerate other medications by the oral or enteral route  If you have any questions about this conversion, please contact the Pharmacy Department (phone [phone]).  Thank you.  

## 2016-05-25 MED ORDER — KETOROLAC TROMETHAMINE 15 MG/ML IJ SOLN
15.0000 mg | Freq: Three times a day (TID) | INTRAMUSCULAR | Status: DC | PRN
  Administered 2016-05-25 – 2016-05-26 (×4): 15 mg via INTRAVENOUS
  Filled 2016-05-25 (×4): qty 1

## 2016-05-25 MED ORDER — OXYCODONE HCL 5 MG PO TABS: 5.0000 mg | ORAL_TABLET | ORAL | 0 refills | Status: DC | PRN

## 2016-05-25 MED ORDER — OXYCODONE HCL 5 MG PO TABS
5.0000 mg | ORAL_TABLET | ORAL | Status: DC | PRN
  Administered 2016-05-25 – 2016-05-26 (×8): 10 mg via ORAL
  Filled 2016-05-25 (×8): qty 2

## 2016-05-25 MED ORDER — KETOROLAC TROMETHAMINE 10 MG PO TABS: 10.0000 mg | ORAL_TABLET | Freq: Four times a day (QID) | ORAL | 0 refills | Status: DC | PRN

## 2016-05-25 NOTE — Discharge Instructions (Addendum)
LAPAROSCOPIC SURGERY: POST OP INSTRUCTIONS  1. DIET: Follow a light bland diet the first 24 hours after arrival home, such as soup, liquids, crackers, etc. Be sure to include lots of fluids daily. Avoid fast food or heavy meals as your are more likely to get nauseated. Eat a low fat the next few days after surgery.  2. Take your usually prescribed home medications unless otherwise directed. 3. PAIN CONTROL:  1. Pain is best controlled by a usual combination of three different methods TOGETHER:  1. Ice/Heat 2. Over the counter pain medication 3. Prescription pain medication 2. Most patients will experience some swelling and bruising around the incisions. Ice packs or heating pads (30-60 minutes up to 6 times a day) will help. Use ice for the first few days to help decrease swelling and bruising, then switch to heat to help relax tight/sore spots and speed recovery. Some people prefer to use ice alone, heat alone, alternating between ice & heat. Experiment to what works for you. Swelling and bruising can take several weeks to resolve.  3. It is helpful to take an over-the-counter pain medication regularly for the first few weeks. Choose one of the following that works best for you:  1. Naproxen (Aleve, etc) Two 220mg  tabs twice a day 2. Ibuprofen (Advil, etc) Three 200mg  tabs four times a day (every meal & bedtime) 3. Acetaminophen (Tylenol, etc) 500-650mg  four times a day (every meal & bedtime) 4. A prescription for pain medication (such as oxycodone, hydrocodone, etc) should be given to you upon discharge. Take your pain medication as prescribed.  1. If you are having problems/concerns with the prescription medicine (does not control pain, nausea, vomiting, rash, itching, etc), please call us [phone] to see if we need to switch you to a different pain medicine that will work better for you and/or control your side effect better. 2. If you need a refill on your pain medication, please contact  your pharmacy. They will contact our office to request authorization. Prescriptions will not be filled after 5 pm or on week-ends. 4. Avoid getting constipated. Between the surgery and the pain medications, it is common to experience some constipation. Increasing fluid intake and taking a fiber supplement (such as Metamucil, Citrucel, FiberCon, MiraLax, etc) 1-2 times a day regularly will usually help prevent this problem from occurring. A mild laxative (prune juice, Milk of Magnesia, MiraLax, etc) should be taken according to package directions if there are no bowel movements after 48 hours.  5. Watch out for diarrhea. If you have many loose bowel movements, simplify your diet to bland foods & liquids for a few days. Stop any stool softeners and decrease your fiber supplement. Switching to mild anti-diarrheal medications (Kayopectate, Pepto Bismol) can help. If this worsens or does not improve, please call us. 6. Wash / shower every day. You may shower over the dressings as they are waterproof. Continue to shower over incision(s) after the dressing is off. 7. Remove your waterproof bandages 5 days after surgery. You may leave the incision open to air. You may replace a dressing/Band-Aid to cover the incision for comfort if you wish.  8. ACTIVITIES as tolerated:  1. You may resume regular (light) daily activities beginning the next day--such as daily self-care, walking, climbing stairs--gradually increasing activities as tolerated. If you can walk 30 minutes without difficulty, it is safe to try more intense activity such as jogging, treadmill, bicycling, low-impact aerobics, swimming, etc. 2. Save the most intensive and strenuous activity for  last such as sit-ups, heavy lifting, contact sports, etc Refrain from any heavy lifting or straining until you are off narcotics for pain control.  3. DO NOT PUSH THROUGH PAIN. Let pain be your guide: If it hurts to do something, don't do it. Pain is your body warning  you to avoid that activity for another week until the pain goes down. 4. You may drive when you are no longer taking prescription pain medication, you can comfortably wear a seatbelt, and you can safely maneuver your car and apply brakes. 5. You may have sexual intercourse when it is comfortable.  9. FOLLOW UP in our office  1. Please call [facility] at [phone] to set up an appointment to see your surgeon in the office for a follow-up appointment approximately 2-3 weeks after your surgery. 2. Make sure that you call for this appointment the day you arrive home to insure a convenient appointment time.      10. IF YOU HAVE DISABILITY OR FAMILY LEAVE FORMS, BRING THEM TO THE               OFFICE FOR PROCESSING.   WHEN TO CALL us [phone]:  1. Poor pain control 2. Reactions / problems with new medications (rash/itching, nausea, etc)  3. Fever over 101.5 F (38.5 C) 4. Inability to urinate 5. Nausea and/or vomiting 6. Worsening swelling or bruising 7. Continued bleeding from incision. 8. Increased pain, redness, or drainage from the incision  The clinic staff is available to answer your questions during regular business hours (8:30am-5pm). Please dont hesitate to call and ask to speak to one of our nurses for clinical concerns.  If you have a medical emergency, go to the nearest emergency room or call 911.  A surgeon from [facility] is always on call at the [facility], [place]  [address], [address], [place], [place] [address] ?  MAIN: [phone] ? TOLL FREE: [phone] ?  FAX [phone]  [email or web address]    Bulb Drain Home Care A bulb drain consists of a thin rubber tube and a soft, round bulb that creates a gentle suction. The rubber tube is placed in the area where you had surgery. A bulb is attached to the end of the tube that is outside the body. The bulb drain removes excess fluid that normally builds up in a  surgical wound after surgery. The color and amount of fluid will vary. Immediately after surgery, the fluid is bright red and is a little thicker than water. It may gradually change to a yellow or pink color and become more thin and water-like. When the amount decreases to about 1 or 2 tbsp in 24 hours, your health care provider will usually remove it. DAILY CARE  Keep the bulb flat (compressed) at all times, except while emptying it. The flatness creates suction. You can flatten the bulb by squeezing it firmly in the middle and then closing the cap.  Keep sites where the tube enters the skin dry and covered with a bandage (dressing).  Secure the tube 1-2 in (2.5-5.1 cm) below the insertion sites to keep it from pulling on your stitches. The tube is stitched in place and will not slip out.  Secure the bulb as directed by your health care provider.  For the first 3 days after surgery, there usually is more fluid in the bulb. Empty the bulb whenever it becomes half full because the bulb does not  create enough suction if it is too full. The bulb could also overflow. Write down how much fluid you remove each time you empty your drain. Add up the amount removed in 24 hours.  Empty the bulb at the same time every day once the amount of fluid decreases and you only need to empty it once a day. Write down the amounts and the 24-hour totals to give to your health care provider. This helps your health care provider know when the tubes can be removed. EMPTYING THE BULB DRAIN Before emptying the bulb, get a measuring cup, a piece of paper and a pen, and wash your hands.  Gently run your fingers down the tube (stripping) to empty any drainage from the tubing into the bulb. This may need to be done several times a day to clear the tubing of clots and tissue.  Open the bulb cap to release suction, which causes it to inflate. Do not touch the inside of the cap.  Gently run your fingers down the tube (stripping)  to empty any drainage from the tubing into the bulb.  Hold the cap out of the way, and pour fluid into the measuring cup.   Squeeze the bulb to provide suction.  Replace the cap.   Check the tape that holds the tube to your skin. If it is becoming loose, you can remove the loose piece of tape and apply a new one. Then, pin the bulb to your shirt.   Write down the amount of fluid you emptied out. Write down the date and each time you emptied your bulb drain. (If there are 2 bulbs, note the amount of drainage from each bulb and keep the totals separate. Your health care provider will want to know the total amounts for each drain and which tube is draining more.)   Flush the fluid down the toilet and wash your hands.   Call your health care provider once you have less than 2 tbsp of fluid collecting in the bulb drain every 24 hours. If there is drainage around the tube site, change dressings and keep the area dry. Cleanse around tube with sterile saline and place dry gauze around site. This gauze should be changed when it is soiled. If it stays clean and unsoiled, it should still be changed daily.  SEEK MEDICAL CARE IF:  Your drainage has a bad smell or is cloudy.   You have a fever.   Your drainage is increasing instead of decreasing.   Your tube fell out.   You have redness or swelling around the tube site.   You have drainage from a surgical wound.   Your bulb drain will not stay flat after you empty it.  MAKE SURE YOU:   Understand these instructions.  Will watch your condition.  Will get help right away if you are not doing well or get worse.   This information is not intended to replace advice given to you by your health care provider. Make sure you discuss any questions you have with your health care provider.   Document Released: 07/16/2000 Document Revised: 08/09/2014 Document Reviewed: 02/05/2015 Elsevier Interactive Patient Education [employer]2016 Elsevier  Inc.

## 2016-05-25 NOTE — Progress Notes (Signed)
Patient ID: Karen Buchanan, female   DOB: 02/13/1961, 55 y.o.   MRN: [id number]  [facility]  4 Days Post-Op  Subjective: Still in significant pain. Tolerating small amounts of food. No flatus or BM. Ambulated multiple times yesterday.  Objective: Vital signs in last 24 hours: Temp:  [98.2 F (36.8 C)-98.9 F (37.2 C)] 98.9 F (37.2 C) (10/24 0522) Pulse Rate:  [77-85] 77 (10/24 0522) Resp:  [16] 16 (10/24 0522) BP: (111-117)/(68-69) 115/68 (10/24 0522) SpO2:  [99 %] 99 % (10/24 0522) Last BM Date: 05/20/16  Intake/Output from previous day: 10/23 0701 - 10/24 0700 In: 1730 [P.O.:480; I.V.:1200; IV Piggyback:50] Out: 5120 [Urine:4950; Drains:170] Intake/Output this shift: No intake/output data recorded.  PE: Gen:  Alert, NAD, pleasant Pulm:  effort normal Abd: Soft, mildly distended, hypoactive BS, incisions C/D/I, drain with minimal serosanguinous drainage  Lab Results:   Recent Labs  05/23/16 0014  WBC 7.8  HGB 11.6*  HCT 34.9*  PLT 162   BMET No results for input(s): NA, K, CL, CO2, GLUCOSE, BUN, CREATININE, CALCIUM in the last 72 hours. PT/INR No results for input(s): LABPROT, INR in the last 72 hours. CMP     Component Value Date/Time   NA 136 05/22/2016 0443   K 4.0 05/22/2016 0443   CL 105 05/22/2016 0443   CO2 25 05/22/2016 0443   GLUCOSE 138 (H) 05/22/2016 0443   BUN 14 05/22/2016 0443   CREATININE 0.74 05/22/2016 0443   CALCIUM 7.7 (L) 05/22/2016 0443   PROT 5.5 (L) 05/21/2016 0533   ALBUMIN 3.0 (L) 05/21/2016 0533   AST 22 05/21/2016 0533   ALT 13 (L) 05/21/2016 0533   ALKPHOS 27 (L) 05/21/2016 0533   BILITOT 0.6 05/21/2016 0533   GFRNONAA >60 05/22/2016 0443   GFRAA >60 05/22/2016 0443   Lipase     Component Value Date/Time   LIPASE 23 05/20/2016 1315       Studies/Results: No results found.  Anti-infectives: Anti-infectives    Start     Dose/Rate Route Frequency Ordered Stop   05/21/16 0600   piperacillin-tazobactam (ZOSYN) IVPB 3.375 g     3.375 g 12.5 mL/hr over 240 Minutes Intravenous Every 8 hours 05/20/16 2215     05/20/16 2300  piperacillin-tazobactam (ZOSYN) IVPB 3.375 g     3.375 g 100 mL/hr over 30 Minutes Intravenous  Once 05/20/16 2215 05/21/16 0020   05/20/16 2145  cefTRIAXone (ROCEPHIN) 1 g in dextrose 5 % 50 mL IVPB  Status:  Discontinued     1 g 100 mL/hr over 30 Minutes Intravenous  Once 05/20/16 2143 05/20/16 2215   05/20/16 2145  piperacillin-tazobactam (ZOSYN) IVPB 3.375 g  Status:  Discontinued     3.375 g 12.5 mL/hr over 240 Minutes Intravenous Every 8 hours 05/20/16 2143 05/20/16 2215   05/20/16 1530  cefTRIAXone (ROCEPHIN) 1 g in dextrose 5 % 50 mL IVPB     1 g 100 mL/hr over 30 Minutes Intravenous  Once 05/20/16 1518 05/20/16 1649   05/20/16 1530  metroNIDAZOLE (FLAGYL) IVPB 500 mg     500 mg 100 mL/hr over 60 Minutes Intravenous  Once 05/20/16 1518 05/20/16 1817       Assessment/Plan s/p Procedure(s): APPENDECTOMY LAPAROSCOPIC 05/21/16 Dr. Daphine DeutscherMartin - POD 4 - drain 170cc/24hr - no flatus/BM - continued difficulty with pain control  ID - zosyn day 5 FEN - regular VTE - heparin  Plan - continue IV antibiotics and drain. Continue to try to decrease  IV pain medications, will add toradol to use PRN. Encourage mobilization and PO intake. Once pain control is better will be able to go home.   LOS: 5 days    Edson Snowball , [facility] 05/25/2016, 7:08 AM Pager: [phone] Consults: [phone] Mon-Fri 7:00 am-4:30 pm Sat-Sun 7:00 am-11:30 am

## 2016-05-26 MED ORDER — POLYETHYLENE GLYCOL 3350 17 G PO PACK
17.0000 g | PACK | Freq: Every day | ORAL | Status: DC
  Administered 2016-05-26: 17 g via ORAL
  Filled 2016-05-26: qty 1

## 2016-05-26 MED ORDER — ONDANSETRON 4 MG PO TBDP: 4.0000 mg | ORAL_TABLET | Freq: Four times a day (QID) | ORAL | 0 refills | Status: DC | PRN

## 2016-05-26 NOTE — Discharge Summary (Signed)
[facility] Discharge Summary   Patient ID: Karen Buchanan MRN: [id number] DOB/AGE: Oct 01, 1960 55 y.o.  Admit date: 05/20/2016 Discharge date: 05/26/2016  Admitting Diagnosis: Acute appendicitis  Discharge Diagnosis Patient Active Problem List   Diagnosis Date Noted  . Ruptured appendix Oct 2017 05/20/2016    Consultants None  Imaging: CT abdomen pelvis w contrast 05/20/16: Advanced appendicitis with marked phlegmonous inflammatory change in the right lower quadrant. Probable ruptured appendicitis. No focal drainable collection.  Procedures Dr. Daphine Deutscher (05/21/16) - Laparoscopic Appendectomy  Hospital Course:  Karen Buchanan is a 55yo female who [facility] to [facility]16 with acute onset RLQ abdominal pain. Patient was a transfer from [facility] where workup suggested possible ruptured appendicitis.  Patient was admitted, started on IV antibiotics, and case discussed with Dr. Val Eagle in IR. There was no drainable fluid collection therefore it was decided to proceed with procedure listed above.  A drain was left intraoperatively. Tolerated procedure well and was transferred to the floor. Pain control was difficult postoperatively but improved with time and antiinflammatory.  Diet was advanced as tolerated.  Drain output did decrease but remained at 125cc/24hr on day of discharge. On POD5 the patient was voiding well, tolerating diet, ambulating well, pain well controlled, vital signs stable, incisions c/d/i and felt stable for discharge home.  She will follow-up in our clinic later this week for removal of drain, and with Dr. Daphine Deutscher in 2-3 weeks. She will not need any antibiotics. She will call to confirm appointment date/time.    Physical Exam: Gen: Alert, NAD, pleasant Pulm: effort normal Abd: Soft, mildly distended, +BS, incisions C/D/I, drain with minimal serous drainage    Medication List    STOP taking these medications   celecoxib 100 MG  capsule Commonly known as:  CELEBREX   clindamycin 300 MG capsule Commonly known as:  CLEOCIN   mupirocin cream 2 % Commonly known as:  BACTROBAN   oxyCODONE-acetaminophen 5-325 MG tablet Commonly known as:  PERCOCET/ROXICET     TAKE these medications   5-HTP PO Take 3 capsules by mouth at bedtime.   cyclobenzaprine 10 MG tablet Commonly known as:  FLEXERIL Take 10 mg by mouth 3 (three) times daily as needed for muscle spasms.   dicyclomine 10 MG capsule Commonly known as:  BENTYL Take 10 mg by mouth 4 (four) times daily -  before meals and at bedtime.   ketorolac 10 MG tablet Commonly known as:  TORADOL Take 1 tablet (10 mg total) by mouth every 6 (six) hours as needed.   NON FORMULARY Take 1 tablet by mouth at bedtime. Kavinace   ondansetron 4 MG disintegrating tablet Commonly known as:  ZOFRAN-ODT Take 1 tablet (4 mg total) by mouth every 6 (six) hours as needed for nausea.   oxyCODONE 5 MG immediate release tablet Commonly known as:  Oxy IR/ROXICODONE Take 1-2 tablets (5-10 mg total) by mouth every 4 (four) hours as needed for moderate pain.   PRESCRIPTION MEDICATION Apply 1 application topically daily. Bi-Est Cream   PRESCRIPTION MEDICATION Apply 1 application topically daily. Progestorone Cream   testosterone 50 MG/5GM (1%) Gel Commonly known as:  ANDROGEL Place 5 g onto the skin daily.        Follow-up Information    Valarie Merino, MD. Call today.   Specialty:  General Surgery Why:  2-3 weeks with Dr. Bennett Scrape information: [address] [place] [address] [phone]        [facility] Follow up on 05/28/2016.   Specialty:  General Surgery Why:  Your appointment is 05/28/2016 at 2pm. This appointment will be with a nurse for removal of your drain. Please arrive 30 minutes prior to your appointment to fill out necessary paperwork. Contact information: [address] [place] [id number] [phone]          Signed: Edson SnowballBROOKE A MILLER, [facility] 05/26/2016, 12:33 PM Pager: [phone] Consults: [phone] Mon-Fri 7:00 am-4:30 pm Sat-Sun 7:00 am-11:30 am

## 2016-05-26 NOTE — Progress Notes (Signed)
Pt states that she feels bloated not only in her abdomen, but in her pelvic region and thighs as well.  Weighed pt. Pt reports she was 124lb prior to admission, and weighed 133.3lb on standing scale this am.  Do notice distention of abd and swelling in pelvic region. No redness seen. Nontender in pelvic region. Will continue to monitor.  Sherron MondayGood, Nivedita Mirabella L

## 2016-05-26 NOTE — Progress Notes (Signed)
Discharge instructions discussed with patient and family, verbalized understanding and agreement.  Prescriptions given to patient 

## 2016-05-26 NOTE — Progress Notes (Signed)
Patient ID: Karen Buchanan, female   DOB: [birth year], 55 y.o.   MRN: [id number]  [facility] Progress Note  5 Days Post-Op  Subjective: One episode of nausea yesterday but no vomiting. Tolerating diet. Passing flatus, no BM yet. Pain much better after adding toradol.  Objective: Vital signs in last 24 hours: Temp:  [98.1 F (36.7 C)-98.9 F (37.2 C)] 98.9 F (37.2 C) (10/25 0602) Pulse Rate:  [72-77] 72 (10/25 0602) Resp:  [15-16] 16 (10/25 0602) BP: (99-114)/(58-74) 99/58 (10/25 0602) SpO2:  [99 %-100 %] 99 % (10/25 0602) Last BM Date: 05/20/16  Intake/Output from previous day: 10/24 0701 - 10/25 0700 In: 2340 [P.O.:240; I.V.:2000; IV Piggyback:100] Out: 925 [Urine:800; Drains:125] Intake/Output this shift: No intake/output data recorded.  PE: Gen: Alert, NAD, pleasant Pulm: effort normal Abd: Soft, mildly distended, +BS, incisions C/D/I, drain with minimal serous drainage  Lab Results:  No results for input(s): WBC, HGB, HCT, PLT in the last 72 hours. BMET No results for input(s): NA, K, CL, CO2, GLUCOSE, BUN, CREATININE, CALCIUM in the last 72 hours. PT/INR No results for input(s): LABPROT, INR in the last 72 hours. CMP     Component Value Date/Time   NA 136 05/22/2016 0443   K 4.0 05/22/2016 0443   CL 105 05/22/2016 0443   CO2 25 05/22/2016 0443   GLUCOSE 138 (H) 05/22/2016 0443   BUN 14 05/22/2016 0443   CREATININE 0.74 05/22/2016 0443   CALCIUM 7.7 (L) 05/22/2016 0443   PROT 5.5 (L) 05/21/2016 0533   ALBUMIN 3.0 (L) 05/21/2016 0533   AST 22 05/21/2016 0533   ALT 13 (L) 05/21/2016 0533   ALKPHOS 27 (L) 05/21/2016 0533   BILITOT 0.6 05/21/2016 0533   GFRNONAA >60 05/22/2016 0443   GFRAA >60 05/22/2016 0443   Lipase     Component Value Date/Time   LIPASE 23 05/20/2016 1315       Studies/Results: No results found.  Anti-infectives: Anti-infectives    Start     Dose/Rate Route Frequency Ordered Stop   05/21/16 0600   piperacillin-tazobactam (ZOSYN) IVPB 3.375 g  Status:  Discontinued     3.375 g 12.5 mL/hr over 240 Minutes Intravenous Every 8 hours 05/20/16 2215 05/25/16 1252   05/20/16 2300  piperacillin-tazobactam (ZOSYN) IVPB 3.375 g     3.375 g 100 mL/hr over 30 Minutes Intravenous  Once 05/20/16 2215 05/21/16 0020   05/20/16 2145  cefTRIAXone (ROCEPHIN) 1 g in dextrose 5 % 50 mL IVPB  Status:  Discontinued     1 g 100 mL/hr over 30 Minutes Intravenous  Once 05/20/16 2143 05/20/16 2215   05/20/16 2145  piperacillin-tazobactam (ZOSYN) IVPB 3.375 g  Status:  Discontinued     3.375 g 12.5 mL/hr over 240 Minutes Intravenous Every 8 hours 05/20/16 2143 05/20/16 2215   05/20/16 1530  cefTRIAXone (ROCEPHIN) 1 g in dextrose 5 % 50 mL IVPB     1 g 100 mL/hr over 30 Minutes Intravenous  Once 05/20/16 1518 05/20/16 1649   05/20/16 1530  metroNIDAZOLE (FLAGYL) IVPB 500 mg     500 mg 100 mL/hr over 60 Minutes Intravenous  Once 05/20/16 1518 05/20/16 1817       Assessment/Plan s/p Procedure(s): APPENDECTOMY LAPAROSCOPIC10/20/17 Dr. Daphine Deutscher - POD 5 - drain 125cc/24hr - +flatus - significant improvement in pain with toradol  ID - zosyn 10/19>>10/24 FEN - regular VTE - heparin  Plan - add stool softener and continue to encourage fluids and mobilization. Ready for discharge  later today. Patient will follow-up later this week for drain removal, and with Dr. Daphine DeutscherMartin in 2-3 weeks. She will not need any antibiotics.   LOS: 6 days    Edson SnowballBROOKE A MILLER , [facility] 05/26/2016, 7:24 AM Pager: [phone] Consults: [phone] Mon-Fri 7:00 am-4:30 pm Sat-Sun 7:00 am-11:30 am

## 2016-06-03 ENCOUNTER — Ambulatory Visit
Admission: RE | Admit: 2016-06-03 | Discharge: 2016-06-03 | Disposition: A | Discharge status: Home / Self Care | Payer: BLUE CROSS/BLUE SHIELD | Source: Ambulatory Visit | Attending: Surgery | Admitting: Surgery

## 2016-06-03 ENCOUNTER — Other Ambulatory Visit: Payer: Self-pay | Admitting: Surgery

## 2016-06-03 DIAGNOSIS — Z9889 Other specified postprocedural states: Secondary | ICD-10-CM

## 2016-06-03 MED ORDER — IOPAMIDOL (ISOVUE-300) INJECTION 61%
100.0000 mL | Freq: Once | INTRAVENOUS | Status: AC | PRN
  Administered 2016-06-03: 100 mL via INTRAVENOUS

## 2017-01-20 ENCOUNTER — Ambulatory Visit
Admission: RE | Admit: 2017-01-20 | Discharge: 2017-01-20 | Disposition: A | Discharge status: Home / Self Care | Payer: BLUE CROSS/BLUE SHIELD | Source: Ambulatory Visit | Attending: Anesthesiology | Admitting: Anesthesiology

## 2017-01-20 ENCOUNTER — Other Ambulatory Visit: Payer: Self-pay | Admitting: Anesthesiology

## 2017-01-20 DIAGNOSIS — M25551 Pain in right hip: Secondary | ICD-10-CM

## 2017-01-20 DIAGNOSIS — R52 Pain, unspecified: Secondary | ICD-10-CM

## 2017-01-31 ENCOUNTER — Other Ambulatory Visit: Payer: Self-pay | Admitting: Anesthesiology

## 2017-01-31 DIAGNOSIS — M25511 Pain in right shoulder: Secondary | ICD-10-CM

## 2017-02-13 ENCOUNTER — Ambulatory Visit
Admission: RE | Admit: 2017-02-13 | Discharge: 2017-02-13 | Disposition: A | Discharge status: Home / Self Care | Payer: BLUE CROSS/BLUE SHIELD | Source: Ambulatory Visit | Attending: Anesthesiology | Admitting: Anesthesiology

## 2017-02-13 DIAGNOSIS — M25511 Pain in right shoulder: Secondary | ICD-10-CM

## 2017-02-14 ENCOUNTER — Other Ambulatory Visit: Payer: BLUE CROSS/BLUE SHIELD

## 2017-02-21 ENCOUNTER — Other Ambulatory Visit: Payer: Self-pay | Admitting: Surgery

## 2017-02-21 DIAGNOSIS — E041 Nontoxic single thyroid nodule: Secondary | ICD-10-CM

## 2017-02-24 ENCOUNTER — Other Ambulatory Visit: Payer: Self-pay | Admitting: Anesthesiology

## 2017-02-24 DIAGNOSIS — M25551 Pain in right hip: Secondary | ICD-10-CM

## 2017-03-04 ENCOUNTER — Ambulatory Visit
Admission: RE | Admit: 2017-03-04 | Discharge: 2017-03-04 | Disposition: A | Discharge status: Home / Self Care | Payer: BLUE CROSS/BLUE SHIELD | Source: Ambulatory Visit | Attending: Anesthesiology | Admitting: Anesthesiology

## 2017-03-04 DIAGNOSIS — M25551 Pain in right hip: Secondary | ICD-10-CM

## 2017-04-05 ENCOUNTER — Ambulatory Visit
Admission: RE | Admit: 2017-04-05 | Discharge: 2017-04-05 | Disposition: A | Discharge status: Home / Self Care | Payer: BLUE CROSS/BLUE SHIELD | Source: Ambulatory Visit | Attending: Surgery | Admitting: Surgery

## 2017-04-05 DIAGNOSIS — E041 Nontoxic single thyroid nodule: Secondary | ICD-10-CM

## 2017-04-12 DIAGNOSIS — M25551 Pain in right hip: Secondary | ICD-10-CM | POA: Insufficient documentation

## 2017-04-12 DIAGNOSIS — G8929 Other chronic pain: Secondary | ICD-10-CM | POA: Insufficient documentation

## 2017-04-26 DIAGNOSIS — Z9889 Other specified postprocedural states: Secondary | ICD-10-CM | POA: Insufficient documentation

## 2017-06-08 DIAGNOSIS — E041 Nontoxic single thyroid nodule: Secondary | ICD-10-CM | POA: Insufficient documentation

## 2017-06-08 DIAGNOSIS — N951 Menopausal and female climacteric states: Secondary | ICD-10-CM | POA: Insufficient documentation

## 2018-04-14 ENCOUNTER — Other Ambulatory Visit: Payer: Self-pay | Admitting: Surgery

## 2018-04-14 DIAGNOSIS — E041 Nontoxic single thyroid nodule: Secondary | ICD-10-CM

## 2018-05-29 DIAGNOSIS — Z86010 Personal history of colonic polyps: Secondary | ICD-10-CM | POA: Insufficient documentation

## 2018-10-12 ENCOUNTER — Encounter (HOSPITAL_COMMUNITY): Payer: Self-pay | Admitting: Emergency Medicine

## 2018-10-12 ENCOUNTER — Emergency Department (HOSPITAL_COMMUNITY)
Admission: EM | Admit: 2018-10-12 | Discharge: 2018-10-12 | Disposition: A | Discharge status: Home / Self Care | Payer: BLUE CROSS/BLUE SHIELD | Attending: Emergency Medicine | Admitting: Emergency Medicine

## 2018-10-12 ENCOUNTER — Other Ambulatory Visit: Payer: Self-pay

## 2018-10-12 DIAGNOSIS — Z79899 Other long term (current) drug therapy: Secondary | ICD-10-CM | POA: Insufficient documentation

## 2018-10-12 DIAGNOSIS — R509 Fever, unspecified: Secondary | ICD-10-CM | POA: Diagnosis present

## 2018-10-12 DIAGNOSIS — B349 Viral infection, unspecified: Secondary | ICD-10-CM | POA: Diagnosis not present

## 2018-10-12 LAB — RESPIRATORY PANEL BY PCR

## 2018-10-12 NOTE — ED Notes (Signed)
PA to collect swab and discharge pt to limit exposure to other healthcare staff

## 2018-10-12 NOTE — ED Triage Notes (Signed)
Pt reports flu-like symptoms last night with reported 103 fever. Pt reports recent travel but not to any listed countries, pt went to [place]. Pt denies SOB.

## 2018-10-12 NOTE — Discharge Instructions (Addendum)
Please read attached information. If you experience any new or worsening signs or symptoms please return to the emergency room for evaluation. Please follow-up with your primary care provider or specialist as discussed.  °

## 2018-10-12 NOTE — ED Provider Notes (Signed)
[facility] EMERGENCY DEPARTMENT Provider Note   CSN: [id number] Arrival date & time: 10/12/18  1212    History   Chief Complaint Chief Complaint  Patient presents with  . Flu-Like Symptoms    HPI LALEH MORGANELLI is a 58 y.o. female.     HPI   58 year old female with no significant past medical history presents today with complaints of fever and diarrhea.  Patient notes symptoms started last night with chills, she woke up this morning with a T-max of 103, she started having nonbloody diarrhea with no associated abdominal pain.  She denies any upper respiratory congestion, rhinorrhea, cough, shortness of breath.  She denies any abnormal exposures but notes she recently traveled.  She reports that on February 28 she flew from [place] to [place], from [place] she flew to [place].  She stayed there until March 9, again flying back to [place] at [facility]  She notes she did not receive the influenza vaccine.  She reports she does not smoke.   Past Medical History:  Diagnosis Date  . Chronic back pain   . Pain management     Patient Active Problem List   Diagnosis Date Noted  . Ruptured appendix Oct 2017 05/20/2016    Past Surgical History:  Procedure Laterality Date  . APPENDECTOMY    . LAPAROSCOPIC APPENDECTOMY N/A 05/21/2016   Procedure: APPENDECTOMY LAPAROSCOPIC;  Surgeon: Luretha Murphy, MD;  Location: [facility];  Service: General;  Laterality: N/A;  . RHINOPLASTY    . TONSILLECTOMY       OB History   No obstetric history on file.      Home Medications    Prior to Admission medications   Medication Sig Start Date End Date Taking? Authorizing Provider  5-Hydroxytryptophan (5-HTP PO) Take 3 capsules by mouth at bedtime.     [provider]  cyclobenzaprine (FLEXERIL) 10 MG tablet Take 10 mg by mouth 3 (three) times daily as needed for muscle spasms.    [provider]  dicyclomine (BENTYL) 10 MG capsule Take 10 mg by mouth 4 (four) times daily  -  before meals and at bedtime.    [provider]  ketorolac (TORADOL) 10 MG tablet Take 1 tablet (10 mg total) by mouth every 6 (six) hours as needed. 05/25/16   Meuth, Brooke A, PA-C  NON FORMULARY Take 1 tablet by mouth at bedtime. Kavinace    [provider]  ondansetron (ZOFRAN-ODT) 4 MG disintegrating tablet Take 1 tablet (4 mg total) by mouth every 6 (six) hours as needed for nausea. 05/26/16   Meuth, Brooke A, PA-C  oxyCODONE (OXY IR/ROXICODONE) 5 MG immediate release tablet Take 1-2 tablets (5-10 mg total) by mouth every 4 (four) hours as needed for moderate pain. 05/25/16   Meuth, Lina Sar, PA-C  PRESCRIPTION MEDICATION Apply 1 application topically daily. Bi-Est Cream    [provider]  PRESCRIPTION MEDICATION Apply 1 application topically daily. Progestorone Cream    [provider]  testosterone (ANDROGEL) 50 MG/5GM (1%) GEL Place 5 g onto the skin daily.    [provider]    Family History No family history on file.  Social History Social History   Tobacco Use  . Smoking status: Never Smoker  . Smokeless tobacco: Never Used  Substance Use Topics  . Alcohol use: Yes    Comment: socially  . Drug use: No     Allergies   Penicillins  Review of Systems Review of Systems  All other systems reviewed and are negative.  Physical Exam Updated Vital Signs BP 106/67 (BP Location: Right Arm)   Pulse 92   Temp 99.1 F (37.3 C) (Oral)   Resp 16   Ht 5\' 5"  (1.651 m)   Wt 54.4 kg   LMP  (Exact Date)   SpO2 95%   BMI 19.97 kg/m   Physical Exam Vitals signs and nursing note reviewed.  Constitutional:      Appearance: She is well-developed.  HENT:     Head: Normocephalic and atraumatic.     Comments: Right TM obscured by cerumen, left TM normal-pharynx clear no swelling edema or erythema, no rhinorrhea-cervical lymphadenopathy Eyes:     General: No scleral icterus.       Right eye: No discharge.        Left eye: No  discharge.     Conjunctiva/sclera: Conjunctivae normal.     Pupils: Pupils are equal, round, and reactive to light.  Neck:     Musculoskeletal: Normal range of motion.     Vascular: No JVD.     Trachea: No tracheal deviation.  Pulmonary:     Effort: Pulmonary effort is normal.     Breath sounds: No stridor.  Neurological:     Mental Status: She is alert and oriented to person, place, and time.     Coordination: Coordination normal.  Psychiatric:        Behavior: Behavior normal.        Thought Content: Thought content normal.        Judgment: Judgment normal.     ED Treatments / Results  Labs (all labs ordered are listed, but only abnormal results are displayed) Labs Reviewed  RESPIRATORY PANEL BY PCR    EKG None  Radiology No results found.  Procedures Procedures (including critical care time)  Medications Ordered in ED Medications - No data to display   Initial Impression / Assessment and Plan / ED Course  I have reviewed the triage vital signs and the nursing notes.  Pertinent labs & imaging results that were available during my care of the patient were reviewed by me and considered in my medical decision making (see chart for details).        Labs: viral panel    Imaging:  Consults:  Therapeutics:  Discharge Meds:   Assessment/Plan: 58 year old female presents today with complaints of fever.  Her only acute symptom here presently is diarrhea.  She has no abdominal pain and does not appear to be acutely uncomfortable in the bed.  She has had significant travel recently although she has not been in any countries with high burden of coronavirus.  Patient does note she had influenza earlier in the year but is uncertain what strain she had.  Given patient's symptoms here including her being afebrile with no acute upper respiratory symptoms with reassuring vital signs despite not taking any antipyretics I feel she is stable for outpatient management.  We  have had numerous discussions with infection prevention specialists on testing and treatment for individuals that high risk and I do not feel she is high risk at this time she is stable for outpatient management.  I discussed the plan with the patient she verbalized understanding and agreement to today's plan had no further questions or concerns at the time of discharge.  Patient will return immediately if she develops any abdominal pain or worsening symptoms.   Final Clinical Impressions(s) / ED Diagnoses   Final diagnoses:  Viral  illness    ED Discharge Orders    None       Rosalio Loud 10/12/18 1536    Sabas Sous, MD 10/14/18 [phone]

## 2018-10-13 ENCOUNTER — Telehealth (HOSPITAL_COMMUNITY): Payer: Self-pay

## 2018-10-25 ENCOUNTER — Other Ambulatory Visit: Payer: Self-pay | Admitting: Family Medicine

## 2018-10-25 ENCOUNTER — Other Ambulatory Visit (HOSPITAL_COMMUNITY)
Admission: RE | Admit: 2018-10-25 | Discharge: 2018-10-25 | Disposition: A | Discharge status: Home / Self Care | Payer: BLUE CROSS/BLUE SHIELD | Source: Ambulatory Visit | Attending: Family Medicine | Admitting: Family Medicine

## 2018-10-25 DIAGNOSIS — Z124 Encounter for screening for malignant neoplasm of cervix: Secondary | ICD-10-CM | POA: Diagnosis not present

## 2018-10-27 LAB — CYTOLOGY - PAP
Diagnosis: NEGATIVE
HPV (WINDOPATH): NOT DETECTED

## 2019-03-07 DIAGNOSIS — K648 Other hemorrhoids: Secondary | ICD-10-CM | POA: Insufficient documentation

## 2019-04-06 IMAGING — MR MR HIP*R* W/O CM
4 of 5 series · 17 of 40 positions shown · non-contrast
Comparison: 01/20/2017

CLINICAL DATA: Chronic right hip pain.

EXAM:
MR OF THE RIGHT HIP WITHOUT CONTRAST
TECHNIQUE: Multiplanar, multisequence MR imaging was performed. No intravenous
contrast was administered.

[Series 3: T1 · coronal · 4.0mm · 0.59mm/px · 3 of 34 slices shown (1 of 2)]
[im 4/34]
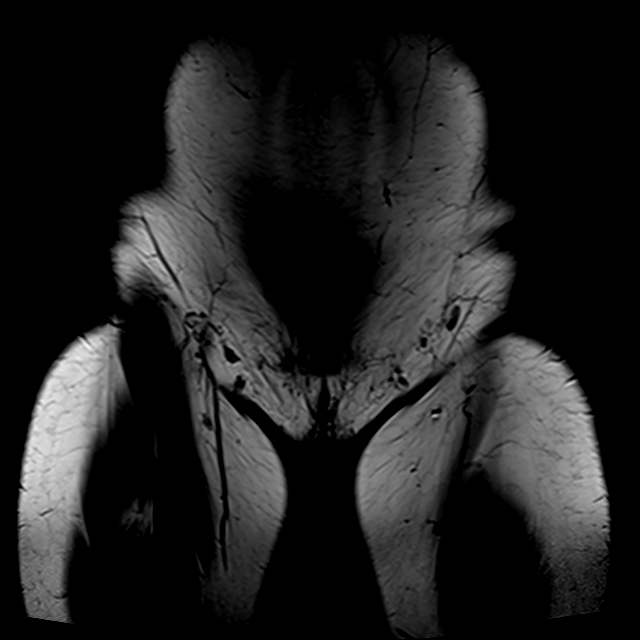
[im 19/34]
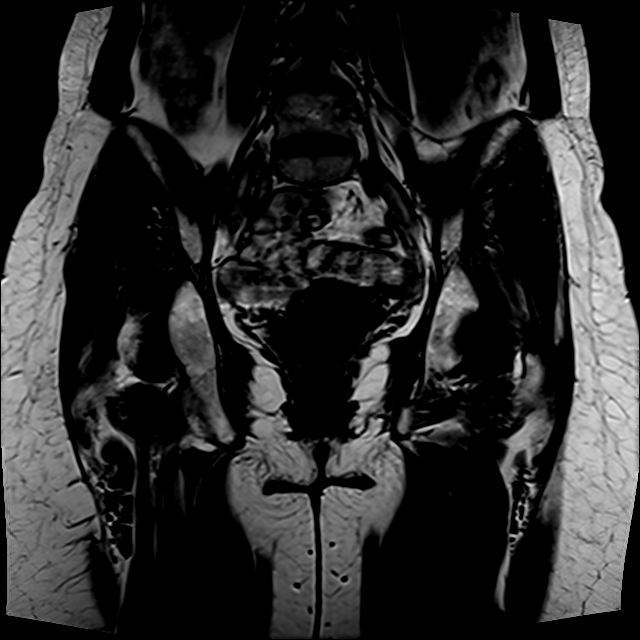
[im 30/34]
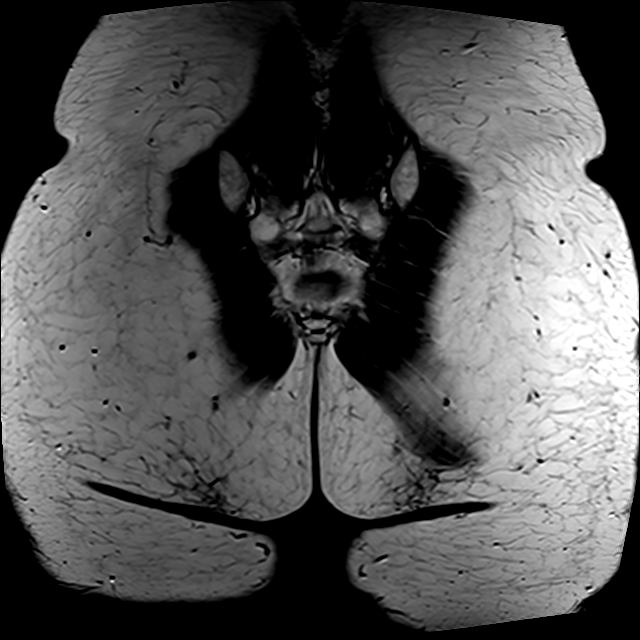

[Series 4: T2 fat-sat · axial · 4.0mm · 0.52mm/px · z∈[-141,-45]mm · 5 of 26 slices shown]
[im 1/26]
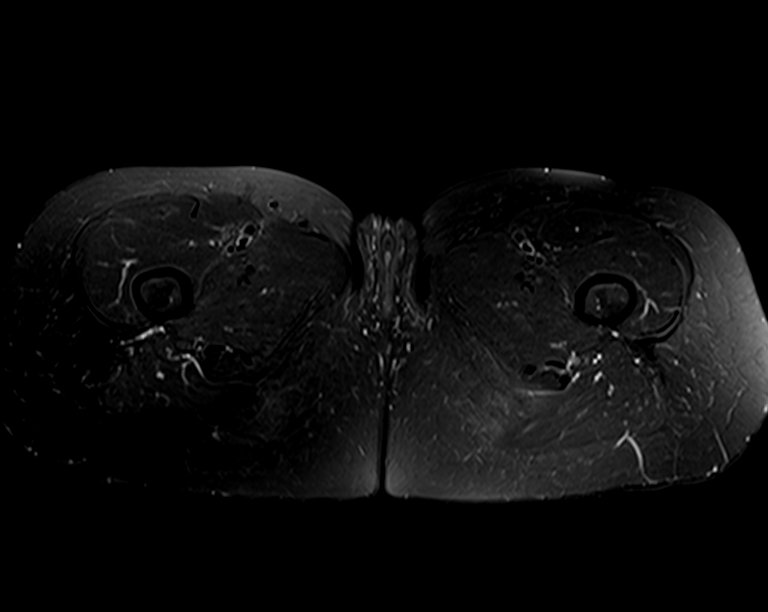
[im 5/26]
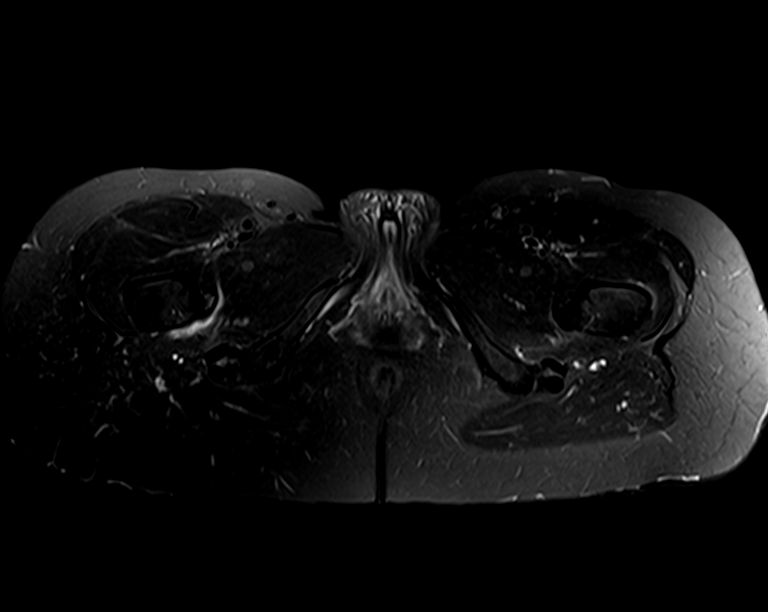
[im 9/26]
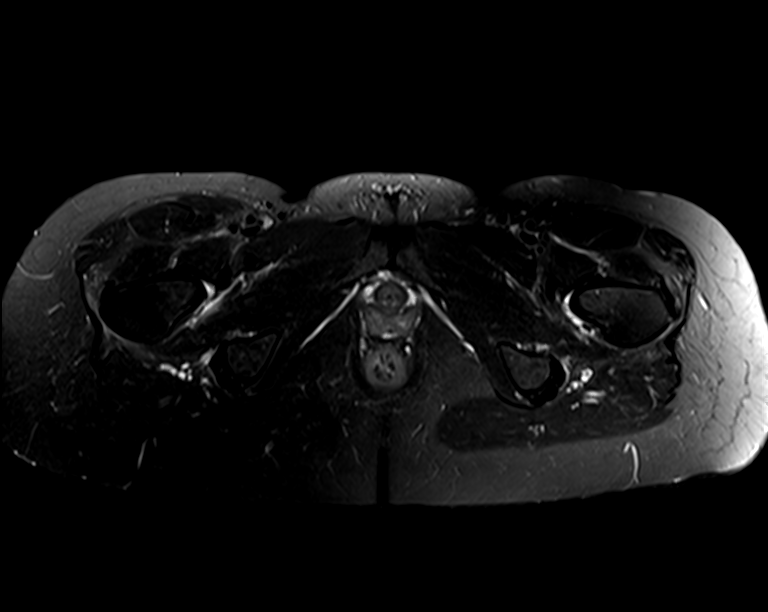
[im 13/26]
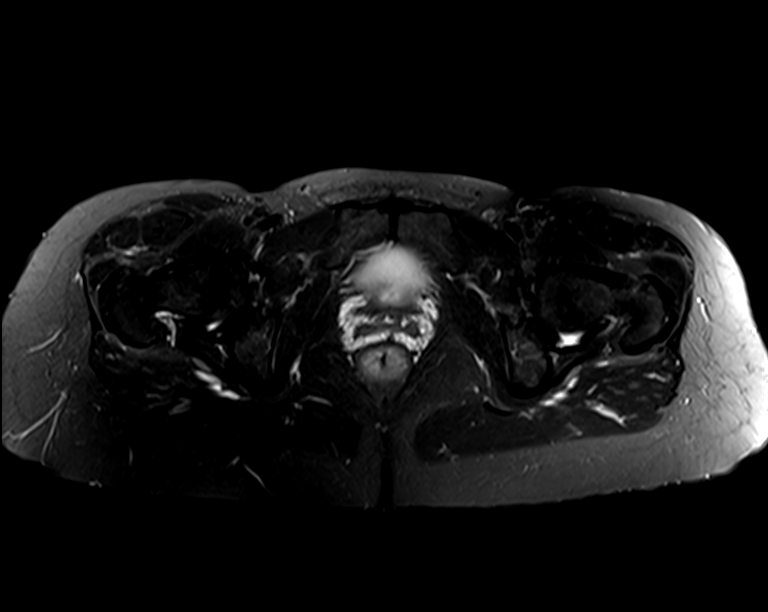
[im 21/26]
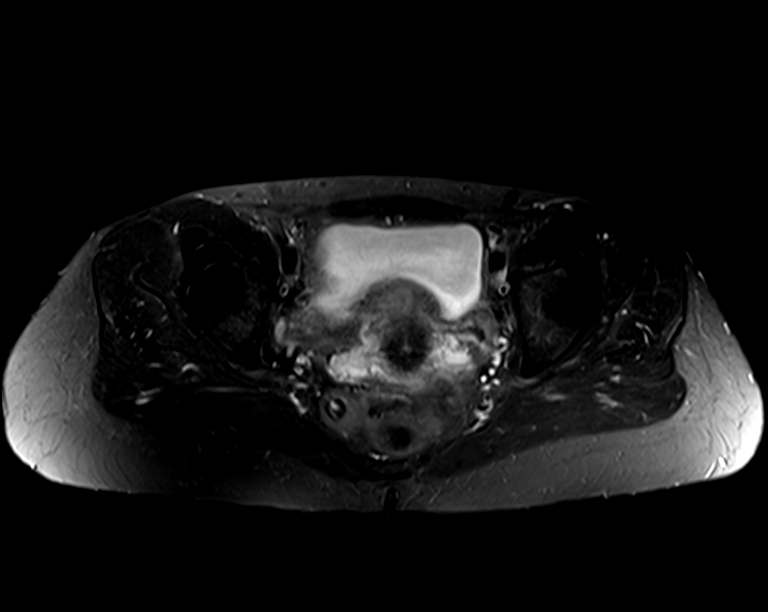

[Series 5: T1 · axial · 4.0mm · 0.59mm/px · z∈[-122,-45]mm · 3 of 26 slices shown (2 of 2)]
[im 5/26]
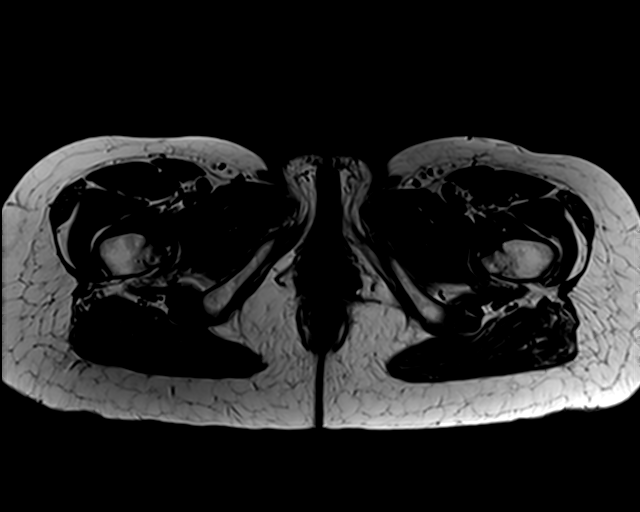
[im 13/26]
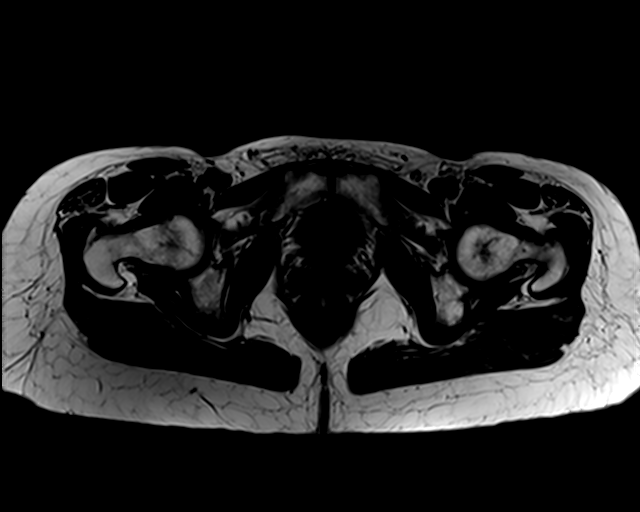
[im 21/26]
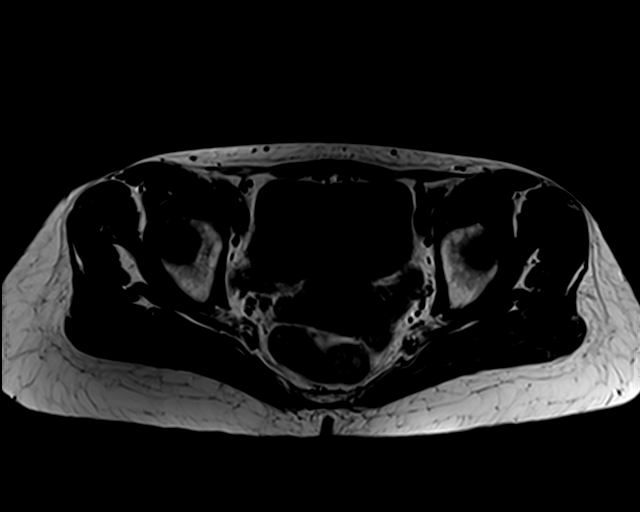

[Series 6: PD · sagittal · 4.0mm · 0.70mm/px · 6 of 22 slices shown]
[im 1/22]
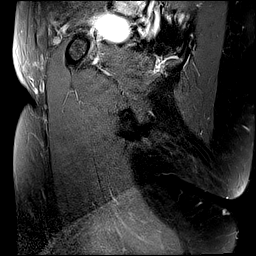
[im 5/22]
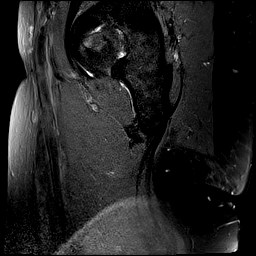
[im 9/22]
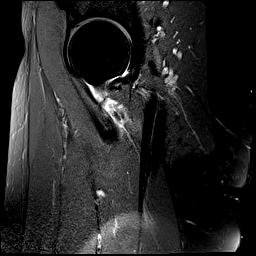
[im 13/22]
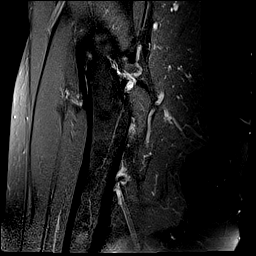
[im 17/22]
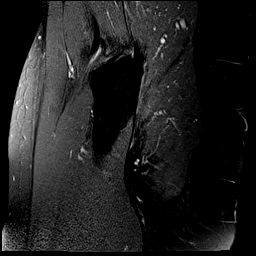
[im 22/22]
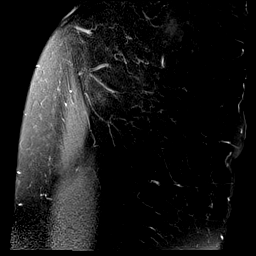

[17 of 40 positions shown; findings below may reference images not displayed]

FINDINGS: Bones: Tiny marginal osteophyte on the inferior medial aspect of the
right femoral head.

Articular cartilage and labrum

Articular cartilage:  Normal.

Labrum:  Normal.

Joint or bursal effusion

Joint effusion:  None

Bursae:  Normal.

Muscles and tendons

Muscles and tendons: Small intrasubstance tear of the distal right
gluteus minimus tendon adjacent to the insertion on the right
greater trochanter. There is a similar but smaller tear of the
distal left gluteus minimus tendon.

Other findings

Miscellaneous:  No adenopathy or mass lesions.
IMPRESSION: 1. Small intrasubstance tear of the distal right gluteus minimus
tendon adjacent to the greater trochanter.
2. Minimal osteophyte formation on the right femoral head.
Otherwise, normal MRI of the right hip.

## 2020-01-02 DIAGNOSIS — Z0000 Encounter for general adult medical examination without abnormal findings: Secondary | ICD-10-CM | POA: Diagnosis not present

## 2020-01-03 ENCOUNTER — Other Ambulatory Visit: Payer: Self-pay | Admitting: Family Medicine

## 2020-01-03 DIAGNOSIS — M8580 Other specified disorders of bone density and structure, unspecified site: Secondary | ICD-10-CM

## 2020-01-07 ENCOUNTER — Other Ambulatory Visit: Payer: Self-pay

## 2020-01-07 ENCOUNTER — Ambulatory Visit
Admission: RE | Admit: 2020-01-07 | Discharge: 2020-01-07 | Disposition: A | Discharge status: Home / Self Care | Payer: BLUE CROSS/BLUE SHIELD | Source: Ambulatory Visit | Attending: Family Medicine | Admitting: Family Medicine

## 2020-01-07 DIAGNOSIS — M8589 Other specified disorders of bone density and structure, multiple sites: Secondary | ICD-10-CM | POA: Diagnosis not present

## 2020-01-07 DIAGNOSIS — Z780 Asymptomatic menopausal state: Secondary | ICD-10-CM | POA: Diagnosis not present

## 2020-01-07 DIAGNOSIS — M8580 Other specified disorders of bone density and structure, unspecified site: Secondary | ICD-10-CM

## 2020-03-27 DIAGNOSIS — E569 Vitamin deficiency, unspecified: Secondary | ICD-10-CM | POA: Diagnosis not present

## 2020-03-27 DIAGNOSIS — E630 Essential fatty acid [EFA] deficiency: Secondary | ICD-10-CM | POA: Diagnosis not present

## 2020-12-09 ENCOUNTER — Other Ambulatory Visit: Payer: Self-pay

## 2020-12-09 ENCOUNTER — Ambulatory Visit: Payer: BLUE CROSS/BLUE SHIELD | Admitting: Family Medicine

## 2020-12-09 ENCOUNTER — Ambulatory Visit (INDEPENDENT_AMBULATORY_CARE_PROVIDER_SITE_OTHER): Payer: No Typology Code available for payment source | Admitting: Family Medicine

## 2020-12-09 DIAGNOSIS — M25512 Pain in left shoulder: Secondary | ICD-10-CM

## 2020-12-09 DIAGNOSIS — N951 Menopausal and female climacteric states: Secondary | ICD-10-CM | POA: Insufficient documentation

## 2020-12-09 DIAGNOSIS — M8448XA Pathological fracture, other site, initial encounter for fracture: Secondary | ICD-10-CM | POA: Insufficient documentation

## 2020-12-09 MED ORDER — MELOXICAM 15 MG PO TABS: 7.5000 mg | ORAL_TABLET | Freq: Every day | ORAL | 6 refills | Status: AC | PRN

## 2020-12-09 NOTE — Progress Notes (Signed)
Office Visit Note   Patient: Karen Buchanan           Date of Birth: [birth year]           MRN: [id number] Visit Date: 12/09/2020 Requested by: Sigmund Hazel, MD [address] [place],  [place] [address] PCP: Sigmund Hazel, MD  Subjective: Chief Complaint  Patient presents with  . Left Shoulder - Pain    Soreness in the left shoulder, with pinching sensation with certain arm motions. Has been having this x 3 months - NKI. Has seen [place] at [employer] [facility] [employer]. Not getting better.     HPI: She is seen at the request of Lorenda Peck for left shoulder pain.  Symptoms started about 3 months ago, no injury.  She first noticed pain all around the shoulder lasting for about 3 days.  Then the pain became more intermittent, and gives her a sharp pinching sensation in certain positions.  Reaching laterally is one of them.  It also bothers her when shifting in bed, so that her pain wakes her up fairly often.  She has not taken anything for the pain.  She has been to see Karin Golden for a few treatments.  She is right-hand dominant and has not had problems with her left shoulder before.  She has a history of right shoulder surgery with good results.                ROS:   All other systems were reviewed and are negative.  Objective: Vital Signs: There were no vitals taken for this visit.  Physical Exam:  General:  Alert and oriented, in no acute distress. Pulm:  Breathing unlabored. Psy:  Normal mood, congruent affect. Skin: No erythema Left shoulder: Full active range of motion, no adhesive capsulitis.  Pain with passive impingement especially with abduction and external rotation.  AC crossover test is positive but she does not have tenderness at the [facility] joint.  No biceps tendon tenderness, rotator cuff strength is 5/5 throughout.   Imaging: No results found.  Assessment & Plan: 1.  Left shoulder impingement, suspect subacromial bursitis. -We will try meloxicam, followed by rotator cuff  exercises.  If pain comes back, then x-rays and ultrasound imaging followed by possible subacromial injection if indicated.     Procedures: No procedures performed        PMFS History: Patient Active Problem List   Diagnosis Date Noted  . Fracture, pathologic, vertebra 12/09/2020  . Menopausal symptom 12/09/2020  . Internal hemorrhoids 03/07/2019  . History of adenomatous polyp of colon 05/29/2018  . Post menopausal syndrome 06/08/2017  . Thyroid nodule 06/08/2017  . S/P arthroscopy of right shoulder 04/26/2017  . Chronic right shoulder pain 04/12/2017  . Right hip pain 04/12/2017  . Ruptured appendix Oct 2017 05/20/2016   Past Medical History:  Diagnosis Date  . Chronic back pain   . Pain management     No family history on file.  Past Surgical History:  Procedure Laterality Date  . APPENDECTOMY    . LAPAROSCOPIC APPENDECTOMY N/A 05/21/2016   Procedure: APPENDECTOMY LAPAROSCOPIC;  Surgeon: Luretha Murphy, MD;  Location: [facility];  Service: General;  Laterality: N/A;  . RHINOPLASTY    . TONSILLECTOMY     Social History   Occupational History  . Not on file  Tobacco Use  . Smoking status: Never Smoker  . Smokeless tobacco: Never Used  Substance and Sexual Activity  . Alcohol use: Yes    Comment: socially  .  Drug use: No  . Sexual activity: Not on file

## 2020-12-16 ENCOUNTER — Telehealth: Payer: Self-pay | Admitting: Family Medicine

## 2020-12-16 MED ORDER — IBUPROFEN 800 MG PO TABS: 800.0000 mg | ORAL_TABLET | Freq: Three times a day (TID) | ORAL | 0 refills | Status: DC | PRN

## 2020-12-16 NOTE — Telephone Encounter (Signed)
Patient called advised the Meloxicam is not working with the pain in her left shoulder. Patient asked if she can try Ibuprofen 800 mg because it has worked in the past when she was in  a lot of pain. The number to contact patient is [phone]

## 2020-12-16 NOTE — Telephone Encounter (Signed)
Please advise 

## 2020-12-16 NOTE — Telephone Encounter (Signed)
Rx sent 

## 2020-12-17 NOTE — Telephone Encounter (Signed)
I called and left this info on her voice mail (full name stated on voice mail). Advised her to give [place] a call with any other issues or questions.

## 2020-12-26 ENCOUNTER — Telehealth: Payer: Self-pay | Admitting: Family Medicine

## 2020-12-26 NOTE — Telephone Encounter (Signed)
Patient called requesting a call back from Dr. Prince Rome. She states she is still experiencing pain in left shoulder was hoping it would be better by now. She is asking what the next steps would be. She also order an ice bag. Please call patient about this matter at [phone].

## 2020-12-26 NOTE — Telephone Encounter (Signed)
I left these options on the patient's voice mail. Advised her to call back or message [place] with how she would like to proceed.

## 2020-12-26 NOTE — Telephone Encounter (Signed)
I would suggest x-rays and either ultrasound imaging, with cortisone injection afterward, or else MRI scan.

## 2020-12-26 NOTE — Telephone Encounter (Signed)
Please advise 

## 2020-12-31 ENCOUNTER — Encounter: Payer: Self-pay | Admitting: Family Medicine

## 2020-12-31 ENCOUNTER — Other Ambulatory Visit: Payer: Self-pay

## 2020-12-31 ENCOUNTER — Ambulatory Visit: Payer: Self-pay

## 2020-12-31 ENCOUNTER — Ambulatory Visit (INDEPENDENT_AMBULATORY_CARE_PROVIDER_SITE_OTHER): Payer: No Typology Code available for payment source | Admitting: Family Medicine

## 2020-12-31 DIAGNOSIS — M25512 Pain in left shoulder: Secondary | ICD-10-CM | POA: Diagnosis not present

## 2020-12-31 NOTE — Progress Notes (Signed)
I saw and examined the patient with Dr. Marga Hoots and agree with assessment and plan as outlined.    Left shoulder pain, partial supraspinatus tear and subacromial bursitis on [place].  Injected today.  Home exercises given.  MRI if fails to improve.

## 2020-12-31 NOTE — Progress Notes (Signed)
Office Visit Note   Patient: Karen Buchanan           Date of Birth: 09/05/1960           MRN: [id number] Visit Date: 12/31/2020 Requested by: Sigmund Hazel, MD [address] [place],  [place] [address] PCP: Sigmund Hazel, MD  Subjective: Chief Complaint  Patient presents with  . Left Shoulder - Pain, Follow-up    [place] evaluation and cortisone injection    HPI: 60yo F presenting to clinic to follow up on left shoulder pain, requesting [place] evaluation. Patient states that her pain has continued to worsen, and she is wondering if she may have torn something. She's also curious about if a cortisone shot may offer her some improvement. Her shoulder has been bothering her for several months, and has not yet improved with dry needling. She has no additional concerns today.                 Objective: Vital Signs: There were no vitals taken for this visit.  Physical Exam:  General:  Alert and oriented, in no acute distress. Pulm:  Breathing unlabored. Psy:  Normal mood, congruent affect. Skin:  Left shoulder with no bruising, rashes, or erythema. Overlying skin intact.   Tenderness to palpation throughout lateral and posterior aspect of left shoulder.   Imaging: Extremity US, Left Shoulder:   Biceps tendon visualized in long and short axis, with no significant surrounding fluid in proximal aspect.  Fibers appear intact. Does have trace effusion around biceps tendon near interception with pec tendon, though fibers remain intact.  Subscapularis tendon intact, with no significant surrounding effusion. Supraspinatus with small defect in the distal portion, with small amount of articular-sided fluid alongside concerning area.  Hyperechoic appearance of subacromial bursa, with increased fluid in bursal space.  Infraspinatus and teres minor both appear intact, without surrounding fluid. AC joint visualized, no significant bony spurring.  Negative geyser sign. Glenohumeral joint visualized  without significant bony spurring or effusion.  No obvious posterior labral pathology.  Impression: Partial tear of supraspinatus distal insertion site, with associated subacromial bursal enlargement.    Assessment & Plan: 60yo F presenting to clinic for left shoulder pain which has not improved with dry needling. US reveals small partial tear in the supraspinatus, as well as enlargement of the subacromial bursa. Discussed possibility of CSI therapy, and patient opted to proceed. -Ultrasound guided bursal injection performed as described below. Aftercare and return precautions discussed. - Given HEP to help strengthen the rotator cuff and encourage healing. - If no improvement, RTC in 4-6 weeks for reevaluation, consideration of MRI.      Procedures: Left Subacromial Bursa Ultrasound-Guided Cortisone Injection:  Risks and benefits of procedure discussed, Patient opted to proceed. Verbal Consent obtained.  Timeout performed.  Skin prepped in a sterile fashion with betadine before further cleansing with alcohol. Ethyl Chloride was used for topical analgesia.  Left subacromial bursa was injected with 3cc 1% Lidocaine without epinephrine under US guidance using a 25G, 1.5in needle. Syringe was removed from the needle, and 6mg  betamethasone was then injected into the bursal space.    Patient tolerated the injection well with no immediate complications. Aftercare instructions were discussed, and patient was given strict return precautions.      PMFS History: Patient Active Problem List   Diagnosis Date Noted  . Fracture, pathologic, vertebra 12/09/2020  . Menopausal symptom 12/09/2020  . Internal hemorrhoids 03/07/2019  . History of adenomatous polyp of colon 05/29/2018  .  Post menopausal syndrome 06/08/2017  . Thyroid nodule 06/08/2017  . S/P arthroscopy of right shoulder 04/26/2017  . Chronic right shoulder pain 04/12/2017  . Right hip pain 04/12/2017  . Ruptured appendix Oct 2017  05/20/2016   Past Medical History:  Diagnosis Date  . Chronic back pain   . Pain management     No family history on file.  Past Surgical History:  Procedure Laterality Date  . APPENDECTOMY    . LAPAROSCOPIC APPENDECTOMY N/A 05/21/2016   Procedure: APPENDECTOMY LAPAROSCOPIC;  Surgeon: Luretha Murphy, MD;  Location: [facility];  Service: General;  Laterality: N/A;  . RHINOPLASTY    . TONSILLECTOMY     Social History   Occupational History  . Not on file  Tobacco Use  . Smoking status: Never Smoker  . Smokeless tobacco: Never Used  Substance and Sexual Activity  . Alcohol use: Yes    Comment: socially  . Drug use: No  . Sexual activity: Not on file

## 2021-01-14 ENCOUNTER — Other Ambulatory Visit: Payer: Self-pay | Admitting: Family Medicine

## 2021-01-14 MED ORDER — IBUPROFEN 800 MG PO TABS: 800.0000 mg | ORAL_TABLET | Freq: Three times a day (TID) | ORAL | 0 refills | Status: AC | PRN

## 2021-04-03 ENCOUNTER — Other Ambulatory Visit: Payer: Self-pay | Admitting: Orthopedic Surgery

## 2021-04-03 DIAGNOSIS — G8929 Other chronic pain: Secondary | ICD-10-CM

## 2021-04-03 DIAGNOSIS — M25512 Pain in left shoulder: Secondary | ICD-10-CM

## 2021-04-09 ENCOUNTER — Other Ambulatory Visit: Payer: Self-pay

## 2021-04-09 ENCOUNTER — Ambulatory Visit
Admission: RE | Admit: 2021-04-09 | Discharge: 2021-04-09 | Disposition: A | Discharge status: Home / Self Care | Payer: No Typology Code available for payment source | Source: Ambulatory Visit | Attending: Orthopedic Surgery | Admitting: Orthopedic Surgery

## 2021-04-09 DIAGNOSIS — M25512 Pain in left shoulder: Secondary | ICD-10-CM

## 2021-04-09 DIAGNOSIS — G8929 Other chronic pain: Secondary | ICD-10-CM

## 2021-04-10 ENCOUNTER — Other Ambulatory Visit: Payer: No Typology Code available for payment source

## 2021-05-19 DIAGNOSIS — M542 Cervicalgia: Secondary | ICD-10-CM | POA: Diagnosis not present

## 2021-05-19 DIAGNOSIS — M25511 Pain in right shoulder: Secondary | ICD-10-CM | POA: Diagnosis not present

## 2021-05-19 DIAGNOSIS — M25512 Pain in left shoulder: Secondary | ICD-10-CM | POA: Diagnosis not present
